# Patient Record
Sex: Female | Born: 1955 | Race: White | Hispanic: No | Marital: Married | Smoking: Never smoker
Health system: Southern US, Community
[De-identification: ages and names within clinical notes are randomized; demographics above are authoritative.]

## PROBLEM LIST (undated history)

## (undated) DIAGNOSIS — K2970 Gastritis, unspecified, without bleeding: Secondary | ICD-10-CM

## (undated) DIAGNOSIS — E785 Hyperlipidemia, unspecified: Secondary | ICD-10-CM

## (undated) DIAGNOSIS — I10 Essential (primary) hypertension: Secondary | ICD-10-CM

## (undated) DIAGNOSIS — H409 Unspecified glaucoma: Secondary | ICD-10-CM

## (undated) HISTORY — PX: THYROID SURGERY: SHX805

## (undated) HISTORY — PX: SHOULDER SURGERY: SHX246

## (undated) HISTORY — PX: CATARACT EXTRACTION, BILATERAL: SHX1313

---

## 2017-06-16 ENCOUNTER — Other Ambulatory Visit: Payer: Self-pay | Admitting: Family Medicine

## 2017-06-16 DIAGNOSIS — M545 Low back pain, unspecified: Secondary | ICD-10-CM

## 2017-06-17 ENCOUNTER — Ambulatory Visit
Admission: RE | Admit: 2017-06-17 | Discharge: 2017-06-17 | Disposition: A | Discharge status: Home / Self Care | Payer: Self-pay | Source: Ambulatory Visit | Attending: Family Medicine | Admitting: Family Medicine

## 2017-07-21 ENCOUNTER — Encounter
Admission: RE | Admit: 2017-07-21 | Discharge: 2017-07-21 | Disposition: A | Discharge status: Home / Self Care | Payer: BLUE CROSS/BLUE SHIELD | Source: Ambulatory Visit | Attending: Neurosurgery | Admitting: Neurosurgery

## 2017-07-21 DIAGNOSIS — I10 Essential (primary) hypertension: Secondary | ICD-10-CM

## 2017-07-21 DIAGNOSIS — Z23 Encounter for immunization: Secondary | ICD-10-CM | POA: Diagnosis not present

## 2017-07-21 DIAGNOSIS — M48061 Spinal stenosis, lumbar region without neurogenic claudication: Secondary | ICD-10-CM | POA: Diagnosis not present

## 2017-07-21 DIAGNOSIS — Z79899 Other long term (current) drug therapy: Secondary | ICD-10-CM | POA: Diagnosis not present

## 2017-07-21 DIAGNOSIS — Z01812 Encounter for preprocedural laboratory examination: Secondary | ICD-10-CM | POA: Insufficient documentation

## 2017-07-21 DIAGNOSIS — M5116 Intervertebral disc disorders with radiculopathy, lumbar region: Secondary | ICD-10-CM | POA: Diagnosis not present

## 2017-07-21 DIAGNOSIS — Z01810 Encounter for preprocedural cardiovascular examination: Secondary | ICD-10-CM | POA: Insufficient documentation

## 2017-07-21 DIAGNOSIS — E785 Hyperlipidemia, unspecified: Secondary | ICD-10-CM | POA: Insufficient documentation

## 2017-07-21 HISTORY — DX: Gastritis, unspecified, without bleeding: K29.70

## 2017-07-21 HISTORY — DX: Unspecified glaucoma: H40.9

## 2017-07-21 HISTORY — DX: Essential (primary) hypertension: I10

## 2017-07-21 HISTORY — DX: Hyperlipidemia, unspecified: E78.5

## 2017-07-21 LAB — CBC
HEMATOCRIT: 43.5 % (ref 35.0–47.0)
HEMOGLOBIN: 15.1 g/dL (ref 12.0–16.0)
MCH: 31.3 pg (ref 26.0–34.0)
MCHC: 34.7 g/dL (ref 32.0–36.0)
MCV: 90.2 fL (ref 80.0–100.0)
Platelets: 163 10*3/uL (ref 150–440)
RBC: 4.81 MIL/uL (ref 3.80–5.20)
RDW: 12.2 % (ref 11.5–14.5)
WBC: 5.2 10*3/uL (ref 3.6–11.0)

## 2017-07-21 LAB — URINALYSIS, ROUTINE W REFLEX MICROSCOPIC
Bilirubin Urine: NEGATIVE
GLUCOSE, UA: NEGATIVE mg/dL
Hgb urine dipstick: NEGATIVE
KETONES UR: NEGATIVE mg/dL
LEUKOCYTES UA: NEGATIVE
NITRITE: NEGATIVE
PROTEIN: NEGATIVE mg/dL
Specific Gravity, Urine: 1.02 (ref 1.005–1.030)
pH: 5 (ref 5.0–8.0)

## 2017-07-21 LAB — DIFFERENTIAL
BASOS ABS: 0 10*3/uL (ref 0–0.1)
BASOS PCT: 0 %
EOS ABS: 0.2 10*3/uL (ref 0–0.7)
Eosinophils Relative: 3 %
Lymphocytes Relative: 26 %
Lymphs Abs: 1.4 10*3/uL (ref 1.0–3.6)
MONOS PCT: 8 %
Monocytes Absolute: 0.4 10*3/uL (ref 0.2–0.9)
NEUTROS PCT: 63 %
Neutro Abs: 3.3 10*3/uL (ref 1.4–6.5)

## 2017-07-21 LAB — BASIC METABOLIC PANEL
ANION GAP: 7 (ref 5–15)
BUN: 22 mg/dL — ABNORMAL HIGH (ref 6–20)
CALCIUM: 9.4 mg/dL (ref 8.9–10.3)
CO2: 31 mmol/L (ref 22–32)
Chloride: 101 mmol/L (ref 101–111)
Creatinine, Ser: 0.5 mg/dL (ref 0.44–1.00)
GFR calc non Af Amer: >60 mL/min (ref >60)
GLUCOSE: 133 mg/dL — AB (ref 65–99)
Potassium: 4.1 mmol/L (ref 3.5–5.1)
Sodium: 139 mmol/L (ref 135–145)

## 2017-07-21 LAB — SURGICAL PCR SCREEN
MRSA, PCR: NEGATIVE
Staphylococcus aureus: NEGATIVE

## 2017-07-21 LAB — TYPE AND SCREEN
ABO/RH(D): O POS
ANTIBODY SCREEN: NEGATIVE

## 2017-07-21 LAB — PROTIME-INR
INR: 0.9
Prothrombin Time: 12.1 seconds (ref 11.4–15.2)

## 2017-07-21 LAB — APTT: APTT: 32 s (ref 24–36)

## 2017-07-21 NOTE — Patient Instructions (Signed)
Your procedure is scheduled on: Wednesday 07/23/17 Report to DAY SURGERY. 2ND FLOOR MEDICAL MALL ENTRANCE. To find out your arrival time please call [phone] between 1PM - 3PM on Tuesday 07/22/17.  Remember: Instructions that are not followed completely may result in serious medical risk, up to and including death, or upon the discretion of your surgeon and anesthesiologist your surgery may need to be rescheduled.    __X__ 1. Do not eat anything after midnight the night before your    procedure.  No gum chewing or hard candies.  You may drink clear   liquids up to 2 hours before you are scheduled to arrive at the   hospital for your procedure. Do not drink clear liquids within 2   hours of scheduled arrival to the hospital as this may lead to your   procedure being delayed or rescheduled.       Clear liquids include:   Water or Apple juice without pulp   Clear carbohydrate beverage such as Clearfast or Gatorade   Black coffee or Clear Tea (no milk, no creamer, do not add anything   to the coffee or tea)    Diabetics should only drink water   __X__ 2. No Alcohol for 24 hours before or after surgery.   ____ 3. Bring all medications with you on the day of surgery if instructed.    __X__ 4. Notify your doctor if there is any change in your medical condition     (cold, fever, infections).             __X___5. No smoking within 24 hours of your surgery.     Do not wear jewelry, make-up, hairpins, clips or nail polish.  Do not wear lotions, powders, or perfumes.   Do not shave 48 hours prior to surgery. Men may shave face and neck.  Do not bring valuables to the hospital.    [facility] is not responsible for any belongings or valuables.               Contacts, dentures or bridgework may not be worn into surgery.  Leave your suitcase in the car. After surgery it may be brought to your room.  For patients admitted to the hospital, discharge time is determined by your                 treatment team.   Patients discharged the day of surgery will not be allowed to drive home.   Please read over the following fact sheets that you were given:   MRSA Information   __X__ Take these medicines the morning of surgery with A SIP OF WATER:    1. GABAPENTIN  2. PANTOPRAZOLE  3.   4.  5.  6.  ____ Fleet Enema (as directed)   __X__ Use CHG Soap/SAGE wipes as directed  ____ Use inhalers on the day of surgery  ____ Stop metformin 2 days prior to surgery    ____ Take 1/2 of usual insulin dose the night before surgery and none on the morning of surgery.   ____ Stop Coumadin/Plavix/aspirin on   __X__ Stop Anti-inflammatories such as Advil, Aleve, Ibuprofen, Motrin, Naproxen, Naprosyn, Goodies,powder, or aspirin products.  OK to take Tylenol.   __X__ Stop supplements, Vitamin E, Fish Oil until after surgery.    ____ Bring C-Pap to the hospital.

## 2017-07-23 ENCOUNTER — Observation Stay
Admission: RE | Admit: 2017-07-23 | Discharge: 2017-07-25 | Disposition: A | Discharge status: Home / Self Care | Payer: BLUE CROSS/BLUE SHIELD | Source: Ambulatory Visit | Attending: Neurosurgery | Admitting: Neurosurgery

## 2017-07-23 ENCOUNTER — Encounter
Admission: RE | Disposition: A | Discharge status: Home / Self Care | Payer: Self-pay | Source: Ambulatory Visit | Attending: Neurosurgery

## 2017-07-23 ENCOUNTER — Ambulatory Visit: Payer: BLUE CROSS/BLUE SHIELD | Admitting: Certified Registered Nurse Anesthetist

## 2017-07-23 ENCOUNTER — Ambulatory Visit: Payer: BLUE CROSS/BLUE SHIELD

## 2017-07-23 DIAGNOSIS — M48062 Spinal stenosis, lumbar region with neurogenic claudication: Secondary | ICD-10-CM

## 2017-07-23 DIAGNOSIS — I10 Essential (primary) hypertension: Secondary | ICD-10-CM | POA: Insufficient documentation

## 2017-07-23 DIAGNOSIS — Z23 Encounter for immunization: Secondary | ICD-10-CM | POA: Insufficient documentation

## 2017-07-23 DIAGNOSIS — E785 Hyperlipidemia, unspecified: Secondary | ICD-10-CM | POA: Insufficient documentation

## 2017-07-23 DIAGNOSIS — M5116 Intervertebral disc disorders with radiculopathy, lumbar region: Secondary | ICD-10-CM | POA: Diagnosis not present

## 2017-07-23 DIAGNOSIS — Z419 Encounter for procedure for purposes other than remedying health state, unspecified: Secondary | ICD-10-CM

## 2017-07-23 DIAGNOSIS — Z79899 Other long term (current) drug therapy: Secondary | ICD-10-CM | POA: Insufficient documentation

## 2017-07-23 DIAGNOSIS — M48061 Spinal stenosis, lumbar region without neurogenic claudication: Secondary | ICD-10-CM | POA: Insufficient documentation

## 2017-07-23 HISTORY — PX: LUMBAR LAMINECTOMY/DECOMPRESSION MICRODISCECTOMY: SHX5026

## 2017-07-23 LAB — ABO/RH: ABO/RH(D): O POS

## 2017-07-23 SURGERY — LUMBAR LAMINECTOMY/DECOMPRESSION MICRODISCECTOMY 2 LEVELS
Anesthesia: General | Site: Spine Lumbar | Wound class: Clean

## 2017-07-23 MED ORDER — ONDANSETRON HCL 4 MG/2ML IJ SOLN: 4.0000 mg | Freq: Once | INTRAMUSCULAR | Status: DC | PRN

## 2017-07-23 MED ORDER — SUGAMMADEX SODIUM 200 MG/2ML IV SOLN
INTRAVENOUS | Status: AC
  Filled 2017-07-23: qty 2

## 2017-07-23 MED ORDER — FENTANYL CITRATE (PF) 100 MCG/2ML IJ SOLN
INTRAMUSCULAR | Status: AC
  Filled 2017-07-23: qty 2

## 2017-07-23 MED ORDER — SODIUM CHLORIDE 0.9 % IV SOLN
INTRAVENOUS | Status: DC
  Administered 2017-07-23 – 2017-07-24 (×2): via INTRAVENOUS

## 2017-07-23 MED ORDER — ONDANSETRON HCL 4 MG/2ML IJ SOLN
INTRAMUSCULAR | Status: AC
  Filled 2017-07-23: qty 2

## 2017-07-23 MED ORDER — SODIUM CHLORIDE 0.9 % IV SOLN
INTRAVENOUS | Status: DC | PRN
  Administered 2017-07-23: 50 ug/min via INTRAVENOUS

## 2017-07-23 MED ORDER — BUPIVACAINE-EPINEPHRINE (PF) 0.5% -1:200000 IJ SOLN
INTRAMUSCULAR | Status: DC | PRN
  Administered 2017-07-23: 6 mL

## 2017-07-23 MED ORDER — DEXAMETHASONE SODIUM PHOSPHATE 10 MG/ML IJ SOLN
INTRAMUSCULAR | Status: DC | PRN
  Administered 2017-07-23: 10 mg via INTRAVENOUS

## 2017-07-23 MED ORDER — AMLODIPINE BESYLATE-VALSARTAN 5-160 MG PO TABS: 1.0000 | ORAL_TABLET | Freq: Every day | ORAL | Status: DC

## 2017-07-23 MED ORDER — PHENYLEPHRINE HCL 10 MG/ML IJ SOLN
INTRAMUSCULAR | Status: AC
  Filled 2017-07-23: qty 1

## 2017-07-23 MED ORDER — SODIUM CHLORIDE 0.9 % IV SOLN
INTRAVENOUS | Status: DC | PRN
  Administered 2017-07-23: 40 mL

## 2017-07-23 MED ORDER — SUCCINYLCHOLINE CHLORIDE 20 MG/ML IJ SOLN
INTRAMUSCULAR | Status: AC
  Filled 2017-07-23: qty 1

## 2017-07-23 MED ORDER — ONDANSETRON HCL 4 MG/2ML IJ SOLN
4.0000 mg | Freq: Four times a day (QID) | INTRAMUSCULAR | Status: DC | PRN
  Administered 2017-07-23 – 2017-07-24 (×2): 4 mg via INTRAVENOUS
  Filled 2017-07-23 (×2): qty 2

## 2017-07-23 MED ORDER — SENNOSIDES-DOCUSATE SODIUM 8.6-50 MG PO TABS: 1.0000 | ORAL_TABLET | Freq: Every evening | ORAL | Status: DC | PRN

## 2017-07-23 MED ORDER — IRBESARTAN 150 MG PO TABS
150.0000 mg | ORAL_TABLET | Freq: Every day | ORAL | Status: DC
  Administered 2017-07-24 – 2017-07-25 (×2): 150 mg via ORAL
  Filled 2017-07-23 (×2): qty 1

## 2017-07-23 MED ORDER — MIDAZOLAM HCL 2 MG/2ML IJ SOLN
INTRAMUSCULAR | Status: DC | PRN
  Administered 2017-07-23: 2 mg via INTRAVENOUS

## 2017-07-23 MED ORDER — PROPOFOL 10 MG/ML IV BOLUS
INTRAVENOUS | Status: DC | PRN
  Administered 2017-07-23: 120 mg via INTRAVENOUS

## 2017-07-23 MED ORDER — FENTANYL CITRATE (PF) 100 MCG/2ML IJ SOLN
INTRAMUSCULAR | Status: DC | PRN
  Administered 2017-07-23 (×2): 25 ug via INTRAVENOUS
  Administered 2017-07-23 (×2): 50 ug via INTRAVENOUS

## 2017-07-23 MED ORDER — ACETAMINOPHEN 10 MG/ML IV SOLN
INTRAVENOUS | Status: AC
  Filled 2017-07-23: qty 100

## 2017-07-23 MED ORDER — EPHEDRINE SULFATE 50 MG/ML IJ SOLN
INTRAMUSCULAR | Status: DC | PRN
  Administered 2017-07-23: 10 mg via INTRAVENOUS
  Administered 2017-07-23: 5 mg via INTRAVENOUS
  Administered 2017-07-23: 10 mg via INTRAVENOUS

## 2017-07-23 MED ORDER — CEFAZOLIN SODIUM-DEXTROSE 1-4 GM/50ML-% IV SOLN
1.0000 g | Freq: Once | INTRAVENOUS | Status: AC
  Administered 2017-07-23: 1 g via INTRAVENOUS

## 2017-07-23 MED ORDER — METHOCARBAMOL 500 MG PO TABS: 500.0000 mg | ORAL_TABLET | Freq: Four times a day (QID) | ORAL | Status: DC | PRN

## 2017-07-23 MED ORDER — BUPIVACAINE HCL (PF) 0.5 % IJ SOLN
INTRAMUSCULAR | Status: DC | PRN
  Administered 2017-07-23: 20 mL

## 2017-07-23 MED ORDER — PROPOFOL 10 MG/ML IV BOLUS
INTRAVENOUS | Status: AC
  Filled 2017-07-23: qty 20

## 2017-07-23 MED ORDER — DEXAMETHASONE SODIUM PHOSPHATE 4 MG/ML IJ SOLN
3.0000 mg | Freq: Three times a day (TID) | INTRAMUSCULAR | Status: DC
  Administered 2017-07-24 – 2017-07-25 (×5): 3 mg via INTRAVENOUS
  Filled 2017-07-23 (×7): qty 0.75

## 2017-07-23 MED ORDER — ACETAMINOPHEN 650 MG RE SUPP: 650.0000 mg | RECTAL | Status: DC | PRN

## 2017-07-23 MED ORDER — FENTANYL CITRATE (PF) 100 MCG/2ML IJ SOLN: 25.0000 ug | INTRAMUSCULAR | Status: DC | PRN

## 2017-07-23 MED ORDER — SODIUM CHLORIDE 0.9% FLUSH
3.0000 mL | Freq: Two times a day (BID) | INTRAVENOUS | Status: DC
  Administered 2017-07-24 – 2017-07-25 (×2): 3 mL via INTRAVENOUS

## 2017-07-23 MED ORDER — BACITRACIN 50000 UNITS IM SOLR
INTRAMUSCULAR | Status: DC | PRN
  Administered 2017-07-23: 50000 [IU]

## 2017-07-23 MED ORDER — ONDANSETRON HCL 4 MG/2ML IJ SOLN
INTRAMUSCULAR | Status: DC | PRN
  Administered 2017-07-23: 4 mg via INTRAVENOUS

## 2017-07-23 MED ORDER — OXYCODONE HCL 5 MG PO TABS
5.0000 mg | ORAL_TABLET | ORAL | Status: DC | PRN
  Administered 2017-07-25: 5 mg via ORAL
  Filled 2017-07-23: qty 2

## 2017-07-23 MED ORDER — METHOCARBAMOL 1000 MG/10ML IJ SOLN
500.0000 mg | Freq: Four times a day (QID) | INTRAVENOUS | Status: DC | PRN
  Filled 2017-07-23: qty 5

## 2017-07-23 MED ORDER — SUCCINYLCHOLINE CHLORIDE 20 MG/ML IJ SOLN
INTRAMUSCULAR | Status: DC | PRN
  Administered 2017-07-23: 100 mg via INTRAVENOUS

## 2017-07-23 MED ORDER — ACETAMINOPHEN 325 MG PO TABS: 650.0000 mg | ORAL_TABLET | ORAL | Status: DC | PRN

## 2017-07-23 MED ORDER — PANTOPRAZOLE SODIUM 40 MG PO TBEC
40.0000 mg | DELAYED_RELEASE_TABLET | Freq: Two times a day (BID) | ORAL | Status: DC
  Administered 2017-07-23 – 2017-07-25 (×4): 40 mg via ORAL
  Filled 2017-07-23 (×4): qty 1

## 2017-07-23 MED ORDER — INFLUENZA VAC SPLIT QUAD 0.5 ML IM SUSY
0.5000 mL | PREFILLED_SYRINGE | INTRAMUSCULAR | Status: AC
  Administered 2017-07-24: 0.5 mL via INTRAMUSCULAR
  Filled 2017-07-23: qty 0.5

## 2017-07-23 MED ORDER — GABAPENTIN 300 MG PO CAPS
300.0000 mg | ORAL_CAPSULE | Freq: Three times a day (TID) | ORAL | Status: DC
  Administered 2017-07-23 – 2017-07-25 (×6): 300 mg via ORAL
  Filled 2017-07-23 (×6): qty 1

## 2017-07-23 MED ORDER — ACETAMINOPHEN 10 MG/ML IV SOLN
INTRAVENOUS | Status: DC | PRN
  Administered 2017-07-23: 1000 mg via INTRAVENOUS

## 2017-07-23 MED ORDER — SODIUM CHLORIDE 0.9 % IV SOLN
250.0000 mL | INTRAVENOUS | Status: DC
  Administered 2017-07-23: 250 mL via INTRAVENOUS

## 2017-07-23 MED ORDER — ACETAMINOPHEN 500 MG PO TABS
1000.0000 mg | ORAL_TABLET | Freq: Four times a day (QID) | ORAL | Status: AC
  Administered 2017-07-23 – 2017-07-24 (×4): 1000 mg via ORAL
  Filled 2017-07-23 (×4): qty 2

## 2017-07-23 MED ORDER — THROMBIN 5000 UNITS EX SOLR
CUTANEOUS | Status: DC | PRN
Start: 2017-07-23 — End: 2017-07-23
  Administered 2017-07-23: 5000 [IU] via TOPICAL

## 2017-07-23 MED ORDER — DEXAMETHASONE SODIUM PHOSPHATE 10 MG/ML IJ SOLN
INTRAMUSCULAR | Status: AC
  Filled 2017-07-23: qty 1

## 2017-07-23 MED ORDER — HYDROMORPHONE HCL 1 MG/ML IJ SOLN: 0.5000 mg | INTRAMUSCULAR | Status: DC | PRN

## 2017-07-23 MED ORDER — METHYLPREDNISOLONE ACETATE 40 MG/ML IJ SUSP
INTRAMUSCULAR | Status: DC | PRN
  Administered 2017-07-23: 40 mg

## 2017-07-23 MED ORDER — ROCURONIUM BROMIDE 50 MG/5ML IV SOLN
INTRAVENOUS | Status: AC
  Filled 2017-07-23: qty 1

## 2017-07-23 MED ORDER — LACTATED RINGERS IV SOLN
INTRAVENOUS | Status: DC
  Administered 2017-07-23 (×2): via INTRAVENOUS

## 2017-07-23 MED ORDER — MIDAZOLAM HCL 2 MG/2ML IJ SOLN
INTRAMUSCULAR | Status: AC
  Filled 2017-07-23: qty 2

## 2017-07-23 MED ORDER — ROCURONIUM BROMIDE 100 MG/10ML IV SOLN
INTRAVENOUS | Status: DC | PRN
  Administered 2017-07-23 (×2): 5 mg via INTRAVENOUS
  Administered 2017-07-23: 30 mg via INTRAVENOUS
  Administered 2017-07-23 (×2): 10 mg via INTRAVENOUS

## 2017-07-23 MED ORDER — ONDANSETRON HCL 4 MG PO TABS: 4.0000 mg | ORAL_TABLET | Freq: Four times a day (QID) | ORAL | Status: DC | PRN

## 2017-07-23 MED ORDER — AMLODIPINE BESYLATE 5 MG PO TABS
5.0000 mg | ORAL_TABLET | Freq: Every day | ORAL | Status: DC
  Administered 2017-07-24: 5 mg via ORAL
  Filled 2017-07-23 (×2): qty 1

## 2017-07-23 MED ORDER — SODIUM CHLORIDE 0.9% FLUSH: 3.0000 mL | INTRAVENOUS | Status: DC | PRN

## 2017-07-23 MED ORDER — LIDOCAINE HCL (CARDIAC) 20 MG/ML IV SOLN
INTRAVENOUS | Status: DC | PRN
  Administered 2017-07-23: 50 mg via INTRAVENOUS

## 2017-07-23 MED ORDER — GELATIN ABSORBABLE 12-7 MM EX MISC
CUTANEOUS | Status: DC | PRN
Start: 2017-07-23 — End: 2017-07-23
  Administered 2017-07-23: 1

## 2017-07-23 MED ORDER — SUGAMMADEX SODIUM 200 MG/2ML IV SOLN
INTRAVENOUS | Status: DC | PRN
  Administered 2017-07-23: 113.4 mg via INTRAVENOUS

## 2017-07-23 SURGICAL SUPPLY — 63 items
BLADE BOVIE TIP EXT 4 (BLADE) ×2 IMPLANT
BUR NEURO DRILL SOFT 3.0X3.8M (BURR) ×2 IMPLANT
CANISTER SUCT 1200ML W/VALVE (MISCELLANEOUS) ×4 IMPLANT
CHLORAPREP W/TINT 26ML (MISCELLANEOUS) ×4 IMPLANT
CNTNR SPEC 2.5X3XGRAD LEK (MISCELLANEOUS) ×1
CONT SPEC 4OZ STER OR WHT (MISCELLANEOUS) ×1
CONTAINER SPEC 2.5X3XGRAD LEK (MISCELLANEOUS) ×1 IMPLANT
COUNTER NEEDLE 20/40 LG (NEEDLE) ×2 IMPLANT
COVER LIGHT HANDLE STERIS (MISCELLANEOUS) ×4 IMPLANT
CUP MEDICINE 2OZ PLAST GRAD ST (MISCELLANEOUS) ×4 IMPLANT
DERMABOND ADVANCED (GAUZE/BANDAGES/DRESSINGS) ×1
DERMABOND ADVANCED .7 DNX12 (GAUZE/BANDAGES/DRESSINGS) ×1 IMPLANT
DRAPE C-ARM 42X72 X-RAY (DRAPES) ×4 IMPLANT
DRAPE LAPAROTOMY 100X77 ABD (DRAPES) ×2 IMPLANT
DRAPE MICROSCOPE SPINE 48X150 (DRAPES) ×2 IMPLANT
DRAPE POUCH INSTRU U-SHP 10X18 (DRAPES) ×2 IMPLANT
DRAPE SURG 17X11 SM STRL (DRAPES) ×8 IMPLANT
DRSG TEGADERM 4X4.75 (GAUZE/BANDAGES/DRESSINGS) ×2 IMPLANT
DRSG TELFA 4X3 1S NADH ST (GAUZE/BANDAGES/DRESSINGS) IMPLANT
DURASEAL APPLICATOR TIP (TIP) IMPLANT
DURASEAL SPINE SEALANT 3ML (MISCELLANEOUS) IMPLANT
ELECT CAUTERY BLADE TIP 2.5 (TIP) ×2
ELECT EZSTD 165MM 6.5IN (MISCELLANEOUS) ×2
ELECTRODE CAUTERY BLDE TIP 2.5 (TIP) ×1 IMPLANT
ELECTRODE EZSTD 165MM 6.5IN (MISCELLANEOUS) ×1 IMPLANT
FRAME EYE SHIELD (PROTECTIVE WEAR) ×2 IMPLANT
GLOVE BIO SURGEON STRL SZ 6.5 (GLOVE) ×4 IMPLANT
GLOVE BIOGEL PI IND STRL 7.0 (GLOVE) ×2 IMPLANT
GLOVE BIOGEL PI INDICATOR 7.0 (GLOVE) ×2
GLOVE SURG SYN 8.5  E (GLOVE) ×3
GLOVE SURG SYN 8.5 E (GLOVE) ×3 IMPLANT
GOWN SRG XL LVL 3 NONREINFORCE (GOWNS) ×1 IMPLANT
GOWN STRL NON-REIN TWL XL LVL3 (GOWNS) ×1
GOWN STRL REUS W/ TWL LRG LVL3 (GOWN DISPOSABLE) ×1 IMPLANT
GOWN STRL REUS W/TWL LRG LVL3 (GOWN DISPOSABLE) ×1
GRADUATE 1200CC STRL 31836 (MISCELLANEOUS) ×2 IMPLANT
IV CATH ANGIO 12GX3 LT BLUE (NEEDLE) ×2 IMPLANT
KIT SPINAL PRONEVIEW (KITS) ×2 IMPLANT
KNIFE BAYONET SHORT DISCETOMY (MISCELLANEOUS) ×2 IMPLANT
MARKER SKIN DUAL TIP RULER LAB (MISCELLANEOUS) ×6 IMPLANT
NDL SAFETY ECLIPSE 18X1.5 (NEEDLE) ×1 IMPLANT
NEEDLE HYPO 18GX1.5 SHARP (NEEDLE) ×1
NEEDLE HYPO 22GX1.5 SAFETY (NEEDLE) ×2 IMPLANT
NS IRRIG 1000ML POUR BTL (IV SOLUTION) ×2 IMPLANT
PACK LAMINECTOMY NEURO (CUSTOM PROCEDURE TRAY) ×2 IMPLANT
PAD ARMBOARD 7.5X6 YLW CONV (MISCELLANEOUS) ×2 IMPLANT
PATTIES SURGICAL .5X1.5 (GAUZE/BANDAGES/DRESSINGS) IMPLANT
SPOGE SURGIFLO 8M (HEMOSTASIS) ×2
SPONGE SURGIFLO 8M (HEMOSTASIS) ×2 IMPLANT
STAPLER SKIN PROX 35W (STAPLE) IMPLANT
SUT DVC VLOC 3-0 CL 6 P-12 (SUTURE) ×2 IMPLANT
SUT NURALON 4 0 TR CR/8 (SUTURE) IMPLANT
SUT VIC AB 0 CT1 27 (SUTURE) ×1
SUT VIC AB 0 CT1 27XCR 8 STRN (SUTURE) ×1 IMPLANT
SUT VIC AB 2-0 CT1 18 (SUTURE) ×2 IMPLANT
SUT VICRYL 0 UR6 27IN ABS (SUTURE) IMPLANT
SYR 20CC LL (SYRINGE) ×2 IMPLANT
SYR 30ML LL (SYRINGE) ×4 IMPLANT
SYR 3ML LL SCALE MARK (SYRINGE) ×2 IMPLANT
SYRINGE 10CC LL (SYRINGE) ×2 IMPLANT
TOWEL OR 17X26 4PK STRL BLUE (TOWEL DISPOSABLE) ×8 IMPLANT
TUBE METRX 18MMX5CM (INSTRUMENTS) ×2 IMPLANT
TUBING CONNECTING 10 (TUBING) ×2 IMPLANT

## 2017-07-23 NOTE — Anesthesia Preprocedure Evaluation (Signed)
Anesthesia Evaluation  Patient identified by MRN, date of birth, ID band Patient awake    Reviewed: Allergy & Precautions, H&P , NPO status , Patient's Chart, lab work & pertinent test results, reviewed documented beta blocker date and time   Airway Mallampati: II  TM Distance: >3 FB Neck ROM: full    Dental  (+) Teeth Intact   Pulmonary neg pulmonary ROS,    Pulmonary exam normal        Cardiovascular Exercise Tolerance: Good hypertension, On Medications negative cardio ROS Normal cardiovascular exam Rhythm:regular Rate:Normal     Neuro/Psych negative neurological ROS  negative psych ROS   GI/Hepatic negative GI ROS, Neg liver ROS,   Endo/Other  negative endocrine ROS  Renal/GU negative Renal ROS  negative genitourinary   Musculoskeletal   Abdominal   Peds  Hematology negative hematology ROS (+)   Anesthesia Other Findings Past Medical History: No date: Gastritis No date: Glaucoma No date: HLD (hyperlipidemia) No date: Hypertension Past Surgical History: No date: CATARACT EXTRACTION, BILATERAL No date: SHOULDER SURGERY; Right No date: THYROID SURGERY   Reproductive/Obstetrics negative OB ROS                             Anesthesia Physical Anesthesia Plan  ASA: II  Anesthesia Plan: General ETT   Post-op Pain Management:    Induction:   PONV Risk Score and Plan: 4 or greater and Ondansetron, Dexamethasone, Midazolam and Propofol infusion  Airway Management Planned:   Additional Equipment:   Intra-op Plan:   Post-operative Plan:   Informed Consent: I have reviewed the patients History and Physical, chart, labs and discussed the procedure including the risks, benefits and alternatives for the proposed anesthesia with the patient or authorized representative who has indicated his/her understanding and acceptance.   Dental Advisory Given  Plan Discussed with:  CRNA  Anesthesia Plan Comments:         Anesthesia Quick Evaluation

## 2017-07-23 NOTE — Anesthesia Procedure Notes (Signed)
Procedure Name: Intubation Date/Time: 07/23/2017 1:19 PM Performed by: Darlyne Russian Pre-anesthesia Checklist: Patient identified, Emergency Drugs available, Suction available, Patient being monitored and Timeout performed Patient Re-evaluated:Patient Re-evaluated prior to induction Oxygen Delivery Method: Circle system utilized Preoxygenation: Pre-oxygenation with 100% oxygen Induction Type: IV induction Ventilation: Mask ventilation without difficulty Laryngoscope Size: Mac and 3 Grade View: Grade II Tube type: Oral Number of attempts: 1 Airway Equipment and Method: Stylet Placement Confirmation: ETT inserted through vocal cords under direct vision,  positive ETCO2 and breath sounds checked- equal and bilateral Secured at: 22 cm Tube secured with: Tape Dental Injury: Teeth and Oropharynx as per pre-operative assessment

## 2017-07-23 NOTE — Transfer of Care (Signed)
Immediate Anesthesia Transfer of Care Note  Patient: Catherine Graham  Procedure(s) Performed: Procedure(s): LUMBAR LAMINECTOMY/DECOMPRESSION MICRODISCECTOMY 2 LEVELS L4-5,POSS.L5-S1 (N/A)  Patient Location: PACU  Anesthesia Type:General  Level of Consciousness: sedated and responds to stimulation  Airway & Oxygen Therapy: Patient Spontanous Breathing and Patient connected to face mask oxygen  Post-op Assessment: Report given to RN and Post -op Vital signs reviewed and stable  Post vital signs: Reviewed and stable  Last Vitals:  Vitals:   07/23/17 1239 07/23/17 1538  BP: 127/80 105/62  Pulse: 76 67  Resp: 16 20  Temp: (!) 36.2 C 36.7 C  SpO2: 99% 100%    Last Pain:  Vitals:   07/23/17 1538  TempSrc:   PainSc: Asleep         Complications: No apparent anesthesia complications

## 2017-07-23 NOTE — Anesthesia Postprocedure Evaluation (Signed)
Anesthesia Post Note  Patient: Catherine Graham  Procedure(s) Performed: Procedure(s) (LRB): LUMBAR LAMINECTOMY/DECOMPRESSION MICRODISCECTOMY 2 LEVELS L4-5,POSS.L5-S1 (N/A)  Patient location during evaluation: PACU Anesthesia Type: General Level of consciousness: awake and alert Pain management: pain level controlled Vital Signs Assessment: post-procedure vital signs reviewed and stable Respiratory status: spontaneous breathing, nonlabored ventilation, respiratory function stable and patient connected to nasal cannula oxygen Cardiovascular status: blood pressure returned to baseline and stable Postop Assessment: no apparent nausea or vomiting Anesthetic complications: no     Last Vitals:  Vitals:   07/23/17 1623 07/23/17 1647  BP:  131/72  Pulse: 74 72  Resp: 16 12  Temp: (!) 36.1 C 36.6 C  SpO2: 98% 99%    Last Pain:  Vitals:   07/23/17 1728  TempSrc:   PainSc: 3                  Lenard Simmer

## 2017-07-23 NOTE — Anesthesia Post-op Follow-up Note (Signed)
Anesthesia QCDR form completed.        

## 2017-07-23 NOTE — Progress Notes (Signed)
  History: Catherine Graham is POD0 s/p L4-5 lumbar decompression including central laminectomy and bilateral medial facetectomies including foraminotomies and right L5-S1 far lateral discectomy. Post operatively patient is doing well, no translator available at the time I spoke with her. Does complain of some right sided low lumbar back pain, where incision sites were made. Denies leg pain at this time.   Physical Exam: Vitals:   07/23/17 1623 07/23/17 1647  BP:  131/72  Pulse: 74 72  Resp: 16 12  Temp: (!) 97 F (36.1 C) 97.8 F (36.6 C)  SpO2: 98% 99%    AA Ox3 CNI  Strength:5/5 throughout, sensation intact BLE, BUE  Assessment/Plan:  Catherine Graham is POD0 and doing well postoperatively. Due to language barrier I am unsure how much she understood the questions I asked. Only complaint at this time is pain at incision site. Pain controlled with robaxin, oxycodone, and dexamethasone. Will reevaluate in the morning. Translator requested.   Plan:  - mobilize - pain control - DVT prophylaxis - PTOT   Ivar Drape PA-C Department of Neurosurgery

## 2017-07-23 NOTE — Op Note (Signed)
Indications: Mrs. Dolores Frame is a 61 yo female with worsening R L5 radiculopathy due to stenosis and L5-S1 disc herniation.  She failed conservative management and elected for surgical intervention.  Findings: L5-S1 disc herniation  Preoperative Diagnosis: Lumbar radiculopathy L5 Postoperative Diagnosis: same   EBL: 25 ml IVF: 1000 ml Drains: none Disposition: Extubated and Stable to PACU Complications: none  No foley catheter was placed.   Preoperative Note:   Risks of surgery discussed include: infection, bleeding, stroke, coma, death, paralysis, CSF leak, nerve/spinal cord injury, numbness, tingling, weakness, complex regional pain syndrome, recurrent stenosis and/or disc herniation, vascular injury, development of instability, neck/back pain, need for further surgery, persistent symptoms, development of deformity, and the risks of anesthesia. They understood these risks and have agreed to proceed.  Operative Note:   1. L4-5 lumbar decompression including central laminectomy and bilateral medial facetectomies including foraminotomies 2. Right L5-S1 far lateral discectomy  The patient was then brought from the preoperative center with intravenous access established.  The patient underwent general anesthesia and endotracheal tube intubation, and was then rotated on the [place] rail top where all pressure points were appropriately padded.  The skin was then thoroughly cleansed.  Perioperative antibiotic prophylaxis was administered.  Sterile prep and drapes were then applied and a timeout was then observed.  C-arm was brought into the field under sterile conditions and under lateral visualization the L4-5 and L5-S1 interspace was identified and marked.  The incision was marked on the right and injected with local anesthetic. Once this was complete a 2 cm incision was opened with the use of a #10 blade knife.  The metrx tubes were sequentially advanced and confirmed in position. An 18mm by 50mm  tube was locked in place to the bed side attachment.  Fluoroscopy was then removed from the field.  The microscope was then sterilely brought into the field and muscle creep was hemostased with a bipolar and resected with a pituitary rongeur.  A Bovie extender was then used to expose the spinous process and lamina.  Careful attention was placed to not violate the facet capsule. A 3 mm matchstick drill bit was then used to make a hemi-laminotomy trough until the ligamentum flavum was exposed.  This was extended to the base of the spinous process and to the contralateral side to remove all the central bone from each side.  Once this was complete and the underlying ligamentum flavum was visualized, it was dissected with a curette and resected with Kerrison rongeurs.  Extensive ligamentum hypertrophy was noted, requiring a substantial amount of time and care for removal.  The dura was identified and palpated. The kerrison rongeur was then used to remove the medial facet bilaterally until no compression was noted.  A balltip probe was used to confirm decompression of the right L5 nerve root at the level of the lateral recess.  Additional attention was paid to completion of the contralateral L5-1 foraminotomy until the left L5 nerve root was completely free.  Once this was complete, L4-5 central decompression including medial facetectomy and foraminotomy was confirmed and decompression on both sides was confirmed. No CSF leak was noted.  A Depo-Medrol soaked Gelfoam pledget was placed in the defect.  The wound was copiously irrigated. The tube system was then removed under microscopic visualization and hemostasis was obtained with a bipolar.    At this point, a second incision 3 cm lateral to the midline at the L5-S1 interspace was opened.  The fascia was opened, and the dilators  were advanced onto the L5 transverse process on the right.  The L5 transverse process and lateral pars of L5 were exposed. The tube was  repositioned and the sacral ala lateral to the L5-S1 facet was exposed.  The drill was then used to remove the lateral margin of the L5-S1 facet and the lateral pars.  The intertransvers membrane was identified and removed.  The L5 nerve root was identified.  A disc herniation deep to the nerve root was identified. This was coagulated, entered, and the extruded fragment was dissected free and removed in piecemeal fashion.  The L5 nerve root was then decompressed.    A Depo-Medrol soaked Gelfoam pledget was placed in the defect.  The wound was copiously irrigated. The tube system was then removed under microscopic visualization and hemostasis was obtained with a bipolar.     At each incision, the fascial layer was reapproximated with the use of a 0 Vicryl suture.  Subcutaneous tissue layer was reapproximated using 2-0 Vicryl suture.  3-0 monocryl was placed in subcuticular fashion. The skin was then cleansed and Dermabond was used to close the skin opening.  Patient was then rotated back to the preoperative bed awakened from anesthesia and taken to recovery all counts are correct in this case.  I performed the entire procedure with the assistance of Ivar Drape PA as an Designer, television/film set.  Ladarious Kresse K. Myer Haff MD

## 2017-07-23 NOTE — Progress Notes (Signed)
Pharmacy consulted for weight based dosing Cefazolin for surgical prophylaxis.  Based on weight < 80kg ordered Cefazolin 1 gm once. Ardyth Harps, RPh 07/23/2017

## 2017-07-23 NOTE — OR Nursing (Signed)
Interpreter present [employer]

## 2017-07-23 NOTE — H&P (Signed)
  I have reviewed and confirmed my history and physical from 07/17/17 with no additions or changes. Plan for lumbar microdiscectomy.  Risks and benefits reviewed.  Heart sounds normal no MRG. Chest Clear to Auscultation Bilaterally.

## 2017-07-24 ENCOUNTER — Encounter: Payer: Self-pay | Admitting: Neurosurgery

## 2017-07-24 DIAGNOSIS — M5116 Intervertebral disc disorders with radiculopathy, lumbar region: Secondary | ICD-10-CM | POA: Diagnosis not present

## 2017-07-24 MED ORDER — METHYLPREDNISOLONE 4 MG PO TBPK: ORAL_TABLET | ORAL | 0 refills | Status: DC

## 2017-07-24 MED ORDER — OXYCODONE HCL 5 MG PO TABS: 5.0000 mg | ORAL_TABLET | ORAL | 0 refills | Status: DC | PRN

## 2017-07-24 MED ORDER — METHOCARBAMOL 500 MG PO TABS: 500.0000 mg | ORAL_TABLET | Freq: Four times a day (QID) | ORAL | 0 refills | Status: DC | PRN

## 2017-07-24 NOTE — Care Management (Signed)
Catherine Graham with [facility] in to deliver walker and discuss /home PT with patient. She refused home health PT repeatedly. Staff plans to get interpreter to speak with patient. RNCM will follow up in the am.

## 2017-07-24 NOTE — Final Progress Note (Signed)
  History: Catherine Graham is POD2 s/p L4-5lumbar decompression including central laminectomy and bilateral medial facetectomies including foraminotomies and right L5-S1 far lateral discectomy. Patient is doing well post operatively. Symptoms prior to surgery have resolved. Patient is eating and voiding without issue. Ambulation is slow with capability of only walking short distances before having to rest. Pain is minimal, patient unable to rate.   Physical Exam: Vitals:   07/24/17 1658 07/24/17 2029 07/25/17 0433 07/25/17 0826  BP: 102/67 106/65 103/64 106/60  Pulse: (!) 58 62 (!) 59 63  Resp: Temp: 99 F (37.2 C) 98 F (36.7 C) 98.5 F (36.9 C) 98 F (36.7 C)  TempSrc: Oral Oral Oral Oral  SpO2: 96% 92% 95% 96%  Weight:      Height:        AA Ox3 CNI Strength:5/5 throughout except right quad 4/5. Sensation intact BUE, BLE Skin: incision site intact. Glue present. Mild swelling and tenderness to palpation. No erythema, warmth, or fluctuance.   Data:   Recent Labs Lab 07/21/17 1551  NA 139  K 4.1  CL 101  CO2 31  BUN 22*  CREATININE 0.50  GLUCOSE 133*  CALCIUM 9.4   No results for input(s): AST, ALT, ALKPHOS in the last 168 hours.  Invalid input(s): TBILI    Recent Labs Lab 07/21/17 1551  WBC 5.2  HGB 15.1  HCT 43.5  PLT 163    Recent Labs Lab 07/21/17 1551  APTT 32  INR 0.90         Assessment/Plan:  Catherine Graham is POD1 and doing well. Pain has been controlled with robaxin, oxycodone, and dexamethasone. Patient is experiencing right lower extremity weakness that limits her ability to ambulate for any length of distance, but she feels this is getting much better. At this time she could benefit from home health or outpatient PT for muscle strengthening, but she is declining services and believes she is capable of independent muscle conditioning. Advised patient that if she changes her mind to contact office and we can get it set up.   Continuing pain control with oxycodone, robaxin, and medrol dose pack.  Advised patient no bending, twisting of lifting greater than 10 pounds for 6 weeks.  Follow up in 2 weeks in clinic, appointment already scheduled.   Ivar Drape PA-C Department of Neurosurgery

## 2017-07-24 NOTE — Care Management Note (Signed)
Case Management Note  Patient Details  Name: Catherine Graham MRN: [id number] Date of Birth: 12-22-1955  Subjective/Objective: RNCM consult for discharge planning. PT pending. Will follow up.Discharging today.                   Action/Plan:   Expected Discharge Date:  07/24/17               Expected Discharge Plan:     In-House Referral:     Discharge planning Services  CM Consult  Post Acute Care Choice:    Choice offered to:     DME Arranged:    DME Agency:     HH Arranged:    HH Agency:     Status of Service:  In process, will continue to follow  If discussed at Long Length of Stay Meetings, dates discussed:    Additional Comments:  [name], RN 07/24/2017, 9:12 AM

## 2017-07-24 NOTE — Progress Notes (Addendum)
  History: Catherine Graham is POD1 s/p L4-5lumbar decompression including central laminectomy and bilateral medial facetectomies including foraminotomies and right L5-S1 far lateral discectomy. Patient was seen with translator present. Patient is doing well post operatively. Complains of back pain at incision site. 6/10. Symptoms prior to surgery have resolved. Patient is eating and voiding without issue, however she hasn't been able to ambulate to restroom at this time. PT evaluation revealed extreme weakness in right leg after walking very short distances with walker.   Physical Exam: Vitals:   07/24/17 0516 07/24/17 0736  BP: 117/78 119/67  Pulse: 66 69  Resp: 18   Temp: 98.4 F (36.9 C) 97.6 F (36.4 C)  SpO2: 96% 94%    AA Ox3 CNI Strength:5/5 throughout except right quad 4/5. Sensation intact BUE, BLE  Data:   Recent Labs Lab 07/21/17 1551  NA 139  K 4.1  CL 101  CO2 31  BUN 22*  CREATININE 0.50  GLUCOSE 133*  CALCIUM 9.4   No results for input(s): AST, ALT, ALKPHOS in the last 168 hours.  Invalid input(s): TBILI    Recent Labs Lab 07/21/17 1551  WBC 5.2  HGB 15.1  HCT 43.5  PLT 163    Recent Labs Lab 07/21/17 1551  APTT 32  INR 0.90         Assessment/Plan:  Catherine Graham is POD1 and doing well. Pain is moderately controlled with robaxin, oxycodone, and dexamethasone. PT evaluation revealed extreme right leg weakness with ambulation. We will continue to monitor and reevaluate strength tomorrow. In the meantime, continue PT for strength improvement.  Language Resources were used for interpretation - Thressa Sheller [phone]   Ivar Drape PA-C Department of Neurosurgery

## 2017-07-24 NOTE — Care Management Note (Signed)
Case Management Note  Patient Details  Name: Catherine Graham MRN: [id number] Date of Birth: 08/25/1956  Subjective/Objective:  PT recommending home health PT. Tc to son Reola Calkins. Further explained PT recommendations. He is agreeable with no agency preference. Referral to [facility] for HHPT. Ordered walker from [facility] as well. Patient lives at home with her spouse who can assist her as needed. Patient was not using DME prior to admission. PCP is Jerl Mina.                      Action/Plan: [facility] for HHPT and walker.  Expected Discharge Date:  07/24/17               Expected Discharge Plan:  Home w Home Health Services  In-House Referral:     Discharge planning Services  CM Consult  Post Acute Care Choice:  Home Health, Durable Medical Equipment Choice offered to:  Adult Children  DME Arranged:  Walker rolling DME Agency:  [facility]  HH Arranged:  PT HH Agency:  [facility]  Status of Service:  In process, will continue to follow  If discussed at Long Length of Stay Meetings, dates discussed:    Additional Comments:  [name], RN 07/24/2017, 2:14 PM

## 2017-07-24 NOTE — Evaluation (Signed)
Occupational Therapy Evaluation Patient Details Name: Catherine Graham MRN: [id number] DOB: Jun 23, 1956 Today's Date: 07/24/2017    History of Present Illness s/p lumbar decompression L4-L5    Clinical Impression   Pt is 61 year old female s/p lumbar decompression of L4-5 and lateral discectomy L5-S1.  Pt was independent in all ADLs prior to surgery with occasional help from her husband with pants over feet and shoes when pain level was high and is eager to return to PLOF.  Pt currently requires min assist for LB dressing while in seated position due to pain and restrictions for no bending or twisting.  She does not require a back brace at this time.  Pt was able to state precautions prior to education in LB dressing with practice with use of reacher and sock aid. Supervision and min cues due to impulsivity and rec supervision at home for first 7-10 days. Pt would benefit from instruction in dressing techniques with or without assistive devices for dressing and bathing skills.  Pt would also benefit from recommendations for home modifications to increase safety in the bathroom and prevent falls. Rec use of a transfer tub bench or shower chair to prevent falls at home when bathing.  No further OT recommended.  Pt provided adaptive equipment catalog and reviewed rec items.    Follow Up Recommendations  No OT follow up    Equipment Recommendations  Other (comment);Tub/shower bench Lexicographer )    Recommendations for Other Services       Precautions / Restrictions Precautions Precautions: Fall;Back Precaution Booklet Issued: Yes (comment) Restrictions Weight Bearing Restrictions: No Other Position/Activity Restrictions: No bending, arching, or twisting      Mobility Bed Mobility Overal bed mobility: Needs Assistance Bed Mobility: Supine to Sit;Sit to Supine     Supine to sit: Modified independent (Device/Increase time) Sit to supine: Min guard   General bed mobility comments: Pt able to  roll onto R side, move LEs off bed and push into seated at EOB utilizing bed rail and pushing into bed.  Pt able to perform with safe, proper technique, no cues needed.  Transfers Overall transfer level: Needs assistance Equipment used: Rolling walker (2 wheeled) Transfers: Sit to/from Stand Sit to Stand: Supervision         General transfer comment: Pt able to rise from seated EOB to standing with proper technique, no cues needed. Pt requires additional time due to weak RLE.     Balance Overall balance assessment: Needs assistance Sitting-balance support: Feet unsupported Sitting balance-Leahy Scale: Good Sitting balance - Comments: Pt able to sit at EOB with no assist   Standing balance support: Bilateral upper extremity supported Standing balance-Leahy Scale: Good Standing balance comment: Pt able to stand with RW with CGA, however fatigues easily due to decreased RLE strength/endurance                            ADL either performed or assessed with clinical judgement   ADL Overall ADL's : Needs assistance/impaired Eating/Feeding: Independent;Set up   Grooming: Wash/dry face;Wash/dry hands;Oral care;Brushing hair;Independent;Set up           Upper Body Dressing : Independent;Set up   Lower Body Dressing: Min guard;Set up;With adaptive equipment;Sit to/from stand Lower Body Dressing Details (indicate cue type and reason): cues for impulsivity when using reacher and sock aid and when ambulating to bathroom and pushed walker into linen bag which was moved to allow more space and  prevent fall. Toilet Transfer: Supervision/safety;Set up;Grab bars;Cueing for safety   Toileting- Clothing Manipulation and Hygiene: Supervision/safety         General ADL Comments: Recommend use of reacher at home for LB dressing skills.  Pt stated she does not wear socks since she always wears slip on sandals.  Rec she wear slip on shoes with rubber soles and not flip flops.      Vision Patient Visual Report: No change from baseline       Perception     Praxis      Pertinent Vitals/Pain Pain Assessment: 0-10 Pain Score: 4  Pain Location: sole of R foot Pain Descriptors / Indicators: Sore Pain Intervention(s): Limited activity within patient's tolerance;Monitored during session;Premedicated before session     Hand Dominance Right   Extremity/Trunk Assessment Upper Extremity Assessment Upper Extremity Assessment: Generalized weakness   Lower Extremity Assessment Lower Extremity Assessment: Defer to PT evaluation RLE Deficits / Details: LE MMT, grossly R 3/5 and L 4/5   Cervical / Trunk Assessment Cervical / Trunk Assessment: Normal   Communication Communication Communication: No difficulties;Other (comment) (Pt stated she did not need interpretor by phone before therapist asked)   Cognition Arousal/Alertness: Awake/alert Behavior During Therapy: WFL for tasks assessed/performed;Impulsive (cues to slow down when ambulating to bathroom) Overall Cognitive Status: Within Functional Limits for tasks assessed                                     General Comments       Exercises Exercises: Other exercises Other Exercises Other Exercises: Supine ther-ex 10x, B ankle pumps, B SLRs, B hip abd/add. Pt able to perform with proper technique, however required min assist to complete R SLRs.    Shoulder Instructions      Home Living Family/patient expects to be discharged to:: Private residence Living Arrangements: Spouse/significant other Available Help at Discharge: Family;Available 24 hours/day Type of Home: House Home Access: Stairs to enter [employer] of Steps: 1 step with no stairs at rear entrance of home, 5 steps no rail in front of home Entrance Stairs-Rails: None Home Layout: Two level Alternate Level Stairs-Number of Steps: 12   Bathroom Shower/Tub: Tub/shower unit;Door   [employer] Toilet: Academic librarian: No   Home Equipment: None          Prior Functioning/Environment Level of Independence: Needs assistance    ADL's / Homemaking Assistance Needed: Pt needed help from her husband for shoes and pants over feet when lower back pain was bad.     Comments: Pt uses office chair with rolling wheels for stability when amb around home, spouse assists pt with most activities around the home. Pt appears to walk only household distances, requires breaks to amb and perform stairs.         OT Problem List: Decreased strength;Decreased range of motion;Decreased activity tolerance;Pain      OT Treatment/Interventions: Self-care/ADL training;DME and/or AE instruction;Patient/family education    OT Goals(Current goals can be found in the care plan section) Acute Rehab OT Goals Patient Stated Goal: to return home OT Goal Formulation: With patient Time For Goal Achievement: 08/07/17 Potential to Achieve Goals: Good ADL Goals Pt Will Perform Lower Body Dressing: with supervision;with adaptive equipment;sit to/from stand (with no cues for impulsivity using FWW when standing) Pt Will Transfer to Toilet: with set-up;with supervision;stand pivot transfer;regular height toilet (with no LOB w/o using grab bar)  OT Frequency: Min 1X/week   Barriers to D/C:            Co-evaluation              AM-PAC PT "6 Clicks" Daily Activity     Outcome Measure Help from another person eating meals?: None Help from another person taking care of personal grooming?: None Help from another person toileting, which includes using toliet, bedpan, or urinal?: A Little Help from another person bathing (including washing, rinsing, drying)?: A Little Help from another person to put on and taking off regular upper body clothing?: None Help from another person to put on and taking off regular lower body clothing?: A Little 6 Click Score: 21   End of Session Equipment Utilized During Treatment: Gait  belt Nurse Communication: Other (comment) (pt urinated in toilet)  Activity Tolerance: Patient tolerated treatment well Patient left: in bed;with call bell/phone within reach;with bed alarm set  OT Visit Diagnosis: Pain;Muscle weakness (generalized) (M62.81) Pain - Right/Left: Right Pain - part of body: Ankle and joints of foot                Time: 1350-1420 OT Time Calculation (min): 30 min Charges:  OT General Charges $OT Visit: 1 Visit OT Evaluation $OT Eval Low Complexity: 1 Low OT Treatments $Self Care/Home Management : 8-22 mins G-Codes: OT G-codes **NOT FOR INPATIENT CLASS** Functional Assessment Tool Used: AM-PAC 6 Clicks Daily Activity Functional Limitation: Self care Self Care Current Status (Z6109): At least 1 percent but less than 20 percent impaired, limited or restricted Self Care Goal Status (U0454): At least 1 percent but less than 20 percent impaired, limited or restricted   Susanne Borders, OTR/L ascom [phone] 07/24/17, 2:39 PM   Catherine Graham 07/24/2017, 2:33 PM

## 2017-07-24 NOTE — Progress Notes (Signed)
Patient's legs very weak and giving out with ambulation assist. 2 person max assist with walker to assist to [facility] and back to bed.

## 2017-07-24 NOTE — Progress Notes (Signed)
Nursing attempted to ambulate patient again. Legs remain very weak and giving out, max  2 person assist with walker to [facility] and back to bed. Patient was also hitting herself hard in the mid back, educated patient to not do this, scheduled tylenol given for pain.

## 2017-07-24 NOTE — Discharge Instructions (Signed)

## 2017-07-24 NOTE — Care Management Obs Status (Signed)
MEDICARE OBSERVATION STATUS NOTIFICATION   Patient Details  Name: Catherine Graham MRN: [id number] Date of Birth: 11/18/1955   Medicare Observation Status Notification Given:  Yes Delivered copy. Patient and son did not want to sign.     [name], RN 07/24/2017, 9:32 AM

## 2017-07-24 NOTE — Progress Notes (Signed)
Physical Therapy Evaluation Patient Details Name: Catherine Graham MRN: [id number] DOB: [birth year] Today's Date: 07/24/2017   History of Present Illness  s/p lumbar decompression L4-L5 including central laminectomy and B medial facetectomies including foraminectomies and R L5-S1 far lateral discectomy   Clinical Impression  Pt is a pleasant 61 year female who was admitted for a L4-L5 lumbar decompression. Pt performed supine there-ex, bed mobility with mod. I, transfers with supervision, amb with RW with min assist and stairs with min assist. Pt demonstrates deficits with RLE strength/endurance, transfers, amb and stairs. Pt able to amb 10 ft at a time, however due to fatigue pt's RLE will buckle and pt will require a break in a chair or in standing with RW.  Pt able to go up/down 1 step backwards with RW with significant effort and cues, at this time pt is not able to safely amb on stairs facing forward. Pt appears motivated to participate in PT however is not aware of her physical limits. Discussed written HEP with pt as pt expressed interested in continuing exercises. Pt's primary language is [place], phone interpreter was available but not needed. Pt would benefit from skilled PT to address above deficits and promote optimal return to home. Recommend transition to HHPT upon DC from acute hospitalization. This entire session was guided, instructed, and directly supervised by Elizabeth Palau, DPT.    Follow Up Recommendations Home health PT, with 24 hour assistance    Equipment Recommendations  Rolling walker with 5" wheels    Recommendations for Other Services       Precautions / Restrictions Precautions Precautions: Fall;Back Precaution Booklet Issued: Yes (comment) Restrictions Weight Bearing Restrictions: No Other Position/Activity Restrictions: No bending, arching, or twisting      Mobility  Bed Mobility Overal bed mobility: Needs Assistance Bed Mobility: Supine to Sit;Sit to  Supine     Supine to sit: Modified independent (Device/Increase time) Sit to supine: Min guard   General bed mobility comments: Pt able to roll onto R side, move LEs off bed and push into seated at EOB utilizing bed rail and pushing into bed.  Pt able to perform with safe, proper technique, no cues needed.  Transfers Overall transfer level: Needs assistance Equipment used: Rolling walker (2 wheeled) Transfers: Sit to/from Stand Sit to Stand: Supervision         General transfer comment: Pt able to rise from seated EOB to standing with proper technique, no cues needed. Pt requires additional time due to weak RLE.   Ambulation/Gait Ambulation/Gait assistance: Min assist Ambulation Distance (Feet): 50 Feet Assistive device: Rolling walker (2 wheeled) Gait Pattern/deviations: Step-to pattern   Gait velocity interpretation:  General Gait Details: Pt required continuous cues for proper sequencing of RW and feet during amb. Pt took short steps, unable to take longer steps without quality of steps decreasing. Pt required several standing breaks to rest, however did not need to rest in a chair.  After amb 10 ft, pt would fatigue and RLE would start to buckle, pt required a rest break.   Stairs Stairs: Yes Stairs assistance: Min assist Stair Management: No rails;Backwards;With walker Number of Stairs: 1 General stair comments: Pt able to go up/down 1 step backwards using RW, practiced twice. Provided cues for proper sequencing, carryover with second attempt. Pt able to maintain balance and control. Pt did not have the LE strength/endurance to safely perform stairs forward.   Wheelchair Mobility    Modified Rankin (Stroke Patients Only)  Balance Overall balance assessment: Needs assistance Sitting-balance support: Feet unsupported Sitting balance-Leahy Scale: Good Sitting balance - Comments: Pt able to sit at EOB with no assist   Standing balance support: Bilateral upper  extremity supported Standing balance-Leahy Scale: Good Standing balance comment: Pt able to stand with RW with CGA, however fatigues easily due to decreased RLE strength/endurance                              Pertinent Vitals/Pain Pain Assessment: 0-10 Pain Score: 6  Pain Location: R LB/hip and R sole of foot Pain Descriptors / Indicators: Sore Pain Intervention(s): Limited activity within patient's tolerance;Monitored during session;Repositioned    Home Living Family/patient expects to be discharged to:: Private residence Living Arrangements: Spouse/significant other Available Help at Discharge: Family;Available 24 hours/day Type of Home: House Home Access: Stairs to enter Entrance Stairs-Rails: None Entrance Stairs-Number of Steps: 1 step with no stairs at rear entrance of home, 5 steps no rail in front of home Home Layout: Two level Home Equipment: None      Prior Function Level of Independence: Needs assistance      ADL's / Homemaking Assistance Needed:   Comments: Pt uses office chair with rolling wheels for stability when amb around home, spouse assists pt with most activities around the home. Pt appears to walk only household distances, requires breaks to amb and perform stairs.      Hand Dominance   Dominant Hand:     Extremity/Trunk Assessment   Upper Extremity Assessment Upper Extremity Assessment: Generalized weakness, UE MMT grossly 4/5    Lower Extremity Assessment Lower Extremity Assessment:  RLE Deficits / Details: LE MMT, grossly R 3/5 and L 4/5    Cervical / Trunk Assessment Cervical / Trunk Assessment: Normal  Communication   Communication: No difficulties;Other (comment) No difficulties;Prefers language other than [place];Interpreter utilized;Other (comment) Able to communicate in English, interpreter not needed  Cognition Arousal/Alertness: Awake/alert Behavior During Therapy: [facility] for tasks assessed/performed; Overall Cognitive  Status: Within Functional Limits for tasks assessed                                        General Comments      Exercises Other Exercises Other Exercises: Supine ther-ex 10x, B ankle pumps, B SLRs, B hip abd/add. Pt able to perform with proper technique, however required min assist to complete R SLRs.    Assessment/Plan    PT Assessment Patient needs continued PT services  PT Problem List Decreased strength;Decreased range of motion;Decreased activity tolerance;Decreased mobility;Decreased knowledge of use of DME;Decreased knowledge of precautions;Pain       PT Treatment Interventions DME instruction;Gait training;Stair training;Therapeutic activities;Therapeutic exercise;Patient/family education    PT Goals (Current goals can be found in the Care Plan section)  Acute Rehab PT Goals Patient Stated Goal: to return home PT Goal Formulation: With patient Time For Goal Achievement: 08/07/17 Potential to Achieve Goals: Good Additional Goals Additional Goal #1: Pt will increase LE strength/endurance to safely amb in home with min rest breaks    Frequency BID   Barriers to discharge        Co-evaluation               AM-PAC PT "6 Clicks" Daily Activity  Outcome Measure Difficulty turning over in bed (including adjusting bedclothes, sheets and blankets)?: None Difficulty moving from  lying on back to sitting on the side of the bed? : None Difficulty sitting down on and standing up from a chair with arms (e.g., wheelchair, bedside commode, etc,.)?: Unable Help needed moving to and from a bed to chair (including a wheelchair)?: A Lot Help needed walking in hospital room?: A Lot Help needed climbing 3-5 steps with a railing? : A Lot 6 Click Score: 15    End of Session Equipment Utilized During Treatment: Gait belt Activity Tolerance: Patient tolerated treatment well;Patient limited by fatigue;Patient limited by pain Patient left: in chair;with chair alarm  set;with call bell/phone within reach; Nurse Communication: Mobility status PT Visit Diagnosis: Unsteadiness on feet (R26.81);Other abnormalities of gait and mobility (R26.89);Muscle weakness (generalized) (M62.81);Difficulty in walking, not elsewhere classified (R26.2);Pain Pain - Right/Left: Right Pain - part of body:  (LB)    Time: 1610-9604 PT Time Calculation (min) (ACUTE ONLY): 38 min   Charges:   PT Evaluation $PT Eval Moderate Complexity: 1 Mod PT Treatments $Gait Training: 8-22 mins $Therapeutic Exercise: 8-22 mins   PT G Codes:   PT G-Codes **NOT FOR INPATIENT CLASS** Functional Assessment Tool Used: AM-PAC 6 Clicks Basic Mobility Functional Limitation: Mobility: Walking and moving around Mobility: Walking and Moving Around Current Status (V4098): At least 40 percent but less than 60 percent impaired, limited or restricted Mobility: Walking and Moving Around Goal Status [phone]): At least 20 percent but less than 40 percent impaired, limited or restricted   Renford Dills, SPT  Catherine Graham 07/24/2017, 3:00 PM

## 2017-07-24 NOTE — Clinical Social Work Note (Signed)
CSW received referral for SNF.  Case discussed with case manager and plan is to discharge home with home health.  CSW to sign off please re-consult if social work needs arise.  Al Gagen R. Kimala Horne, MSW, LCSWA [phone]  

## 2017-07-24 NOTE — Progress Notes (Signed)
Physical Therapy Treatment Patient Details Name: Catherine Graham MRN: [id number] DOB: 1955/11/22 Today's Date: 07/24/2017    History of Present Illness s/p lumbar decompression L4-L5     PT Comments    Participated in exercises as described below.  Pt stated she walked to/from bathroom with nursing earlier.  Agrees to gait this attempt.  Bed mobility without assist.  Pt was able to ambulate 5' on first attempt before fatigue when she grabbed front of walker for support.  Educated on safety.  On second attempt was able to increase distance to 12' before cues to sit by Clinical research associate.  On third attempt, she took 3 steps before buckling and unable to control needing max a x 1 to guide to wheelchair follow.  After rest, was able to transfer to bed with min guard.     Follow Up Recommendations  Home health PT     Equipment Recommendations  Rolling walker with 5" wheels    Recommendations for Other Services       Precautions / Restrictions Precautions Precautions: Fall;Back Precaution Booklet Issued: Yes (comment) Restrictions Weight Bearing Restrictions: No Other Position/Activity Restrictions: No bending, arching, or twisting    Mobility  Bed Mobility Overal bed mobility: Needs Assistance Bed Mobility: Supine to Sit;Sit to Supine     Supine to sit: Modified independent (Device/Increase time) Sit to supine: Min guard   General bed mobility comments: Pt able to roll onto R side, move LEs off bed and push into seated at EOB utilizing bed rail and pushing into bed.  Pt able to perform with safe, proper technique, no cues needed.  Transfers Overall transfer level: Needs assistance Equipment used: Rolling walker (2 wheeled) Transfers: Sit to/from Stand Sit to Stand: Supervision         General transfer comment: Pt able to rise from seated EOB to standing with proper technique, no cues needed. Pt requires additional time due to weak RLE.   Ambulation/Gait Ambulation/Gait assistance:  Min assist Ambulation Distance (Feet): 12 Feet Assistive device: Rolling walker (2 wheeled) Gait Pattern/deviations: Step-to pattern   Gait velocity interpretation: <1.8 ft/sec, indicative of risk for recurrent falls General Gait Details: RLE buckles when fatigued, poor safety when fatigued   Stairs Stairs: Yes   Stair Management: No rails;Backwards;With walker Number of Stairs: 1 General stair comments: Pt able to go up/down 1 step backwards using RW, practiced twice. Provided cues for proper sequencing, carryover with second attempt. Pt able to maintain balance and control. Pt did not have the LE strength/endurance to safely perform stairs forward.   Wheelchair Mobility    Modified Rankin (Stroke Patients Only)       Balance Overall balance assessment: Needs assistance Sitting-balance support: Feet unsupported Sitting balance-Leahy Scale: Good Sitting balance - Comments: Pt able to sit at EOB with no assist   Standing balance support: Bilateral upper extremity supported Standing balance-Leahy Scale: Good Standing balance comment: Pt able to stand with RW with CGA, however fatigues easily due to decreased RLE strength/endurance                             Cognition Arousal/Alertness: Awake/alert Behavior During Therapy: WFL for tasks assessed/performed Overall Cognitive Status: Within Functional Limits for tasks assessed  Exercises Other Exercises Other Exercises: Supine ther-ex 10x, B ankle pumps, B SLRs, B hip abd/add. Pt able to perform with proper technique, however required min assist to complete R SLRs.     General Comments        Pertinent Vitals/Pain Pain Assessment: 0-10 Pain Score: 5  Pain Location: R LB/hip and sole of R foot Pain Descriptors / Indicators: Sore Pain Intervention(s): Limited activity within patient's tolerance    Home Living Family/patient expects to be discharged to::  Private residence Living Arrangements: Spouse/significant other Available Help at Discharge: Family;Available 24 hours/day Type of Home: House Home Access: Stairs to enter Entrance Stairs-Rails: None Home Layout: Two level (able to stay on first floor) Home Equipment: None      Prior Function Level of Independence: Needs assistance      Comments: Pt uses office chair with rolling wheels for stability when amb around home, spouse assists pt with most activities around the home. Pt appears to walk only household distances, requires breaks to amb and perform stairs.    PT Goals (current goals can now be found in the care plan section) Acute Rehab PT Goals Patient Stated Goal: to return home PT Goal Formulation: With patient Time For Goal Achievement: 08/07/17 Potential to Achieve Goals: Good Additional Goals Additional Goal #1: Pt will increase LE strength/endurance to safely amb in home with min rest breaks Progress towards PT goals: Progressing toward goals    Frequency    BID      PT Plan Current plan remains appropriate    Co-evaluation              AM-PAC PT "6 Clicks" Daily Activity  Outcome Measure  Difficulty turning over in bed (including adjusting bedclothes, sheets and blankets)?: None Difficulty moving from lying on back to sitting on the side of the bed? : None Difficulty sitting down on and standing up from a chair with arms (e.g., wheelchair, bedside commode, etc,.)?: A Little Help needed moving to and from a bed to chair (including a wheelchair)?: A Lot Help needed walking in hospital room?: A Lot Help needed climbing 3-5 steps with a railing? : A Lot 6 Click Score: 17    End of Session Equipment Utilized During Treatment: Gait belt Activity Tolerance: Patient tolerated treatment well;Patient limited by fatigue;Patient limited by pain Patient left: in bed;with bed alarm set;with call bell/phone within reach;with family/visitor present;with SCD's  reapplied Nurse Communication: Mobility status PT Visit Diagnosis: Unsteadiness on feet (R26.81);Other abnormalities of gait and mobility (R26.89);Muscle weakness (generalized) (M62.81);Difficulty in walking, not elsewhere classified (R26.2);Pain Pain - Right/Left: Right Pain - part of body:  (LB)     Time: 1610-9604 PT Time Calculation (min) (ACUTE ONLY): 15 min  Charges:  $Gait Training: 8-22 mins                    G Codes:  Functional Assessment Tool Used: AM-PAC 6 Clicks Basic Mobility Functional Limitation: Mobility: Walking and moving around Mobility: Walking and Moving Around Current Status (V4098): At least 40 percent but less than 60 percent impaired, limited or restricted Mobility: Walking and Moving Around Goal Status [phone]): At least 20 percent but less than 40 percent impaired, limited or restricted    Danielle Dess, PTA 07/24/17, 12:53 PM

## 2017-07-25 DIAGNOSIS — M5116 Intervertebral disc disorders with radiculopathy, lumbar region: Secondary | ICD-10-CM | POA: Diagnosis not present

## 2017-07-25 NOTE — Care Management Note (Addendum)
Case Management Note  Patient Details  Name: Charletta Voight MRN: [id number] Date of Birth: [birth year]  Subjective/Objective:  [facility] to follow up with patient following dishcarge                  Action/Plan: Home health has been cancelled by MD. Patient refusing   Expected Discharge Date:  07/25/17               Expected Discharge Plan:  Home w Home Health Services  In-House Referral:     Discharge planning Services  CM Consult  Post Acute Care Choice:  Home Health, Durable Medical Equipment Choice offered to:  Adult Children  DME Arranged:  Walker rolling DME Agency:  [facility]  HH Arranged:  PT HH Agency:  [facility]  Status of Service:  Completed, signed off  If discussed at Long Length of Stay Meetings, dates discussed:    Additional Comments:  [name], RN 07/25/2017, 10:00 AM

## 2017-07-25 NOTE — Progress Notes (Signed)
Physical Therapy Treatment Patient Details Name: Catherine Graham MRN: [id number] DOB: Oct 30, 1956 Today's Date: 07/25/2017    History of Present Illness s/p lumbar decompression L4-L5 including central laminectomy and B medial facetectomies     PT Comments    Pt is making good progress towards goals. Pt performed supine there-ex, bed mobility with mod. I, transfers/amb with RW with CGA. Pt amb a total of 130 ft, amb from bed to toilet, to nurses' station, and back to bed with WC follow. Pt able to amb 60 ft before requiring a seated break, able to communicate when she becomes fatigued and needs to sit. Towards the end, noted shuffling gait pattern, however pt able to self-correct with cues for technique, no RLE buckling. Pt continues to demonstrate deficits with RLE strength and endurance when amb. Pt reports she has been performing written HEP regularly throughout the day, and appears very motivated to participate in PT. Pt expressed disinterest in HHPT and outpatient PT services, says husband is available to help, however discussed importance of continuing PT and pt appeared willing to give HHPT a try.     Follow Up Recommendations  Home health PT     Equipment Recommendations  Rolling walker with 5" wheels    Recommendations for Other Services       Precautions / Restrictions Precautions Precautions: Fall;Back Precaution Booklet Issued: Yes (comment) Restrictions Weight Bearing Restrictions: No Other Position/Activity Restrictions: No bending, arching, or twisting    Mobility  Bed Mobility Overal bed mobility: Modified Independent Bed Mobility: Supine to Sit     Supine to sit: Modified independent (Device/Increase time) Sit to supine: Modified independent (Device/Increase time)   General bed mobility comments: Pt able to roll onto R side and rise to seated at EOB utilizing bed rail with safe proper technique, no cues needed.   Transfers Overall transfer level: Needs  assistance Equipment used: Rolling walker (2 wheeled) Transfers: Sit to/from Stand Sit to Stand: Supervision         General transfer comment: Pt able to rise from seated EOB to standing with proper technique, no cues needed. Does not require additional time. Pt able to transfer to/from toilet with supervision.   Ambulation/Gait Ambulation/Gait assistance: Min assist Ambulation Distance (Feet): 130 Feet Assistive device: Rolling walker (2 wheeled) Gait Pattern/deviations: Step-to pattern;Shuffle     General Gait Details: Pt amb with step-to pattern with proper sequencing of RW and LEs. Pt amb with small steps, no noted RLE buckling. Towards the end of the session noted feet shuffling, cued to lift feets and amb with heel to toe pattern. Pt able to readjust gait. Pt able to amb 90 feet before requiring a break, took a second break when returning to room.    Stairs            Wheelchair Mobility    Modified Rankin (Stroke Patients Only)       Balance Overall balance assessment: Needs assistance Sitting-balance support: Feet supported Sitting balance-Leahy Scale: Good Sitting balance - Comments: Pt able to sit at EOB with no assist   Standing balance support: Bilateral upper extremity supported Standing balance-Leahy Scale: Good Standing balance comment: Pt able to stand with RW with CGA at EOB and in front of toilet, pt able to perform toileting tasks and wash both hands at sink                            Cognition Arousal/Alertness: Awake/alert Behavior  During Therapy: WFL for tasks assessed/performed Overall Cognitive Status: Within Functional Limits for tasks assessed                                        Exercises Other Exercises Other Exercises: Supine ther-ex 15x, R ankle pumps, SLRs, hip abd/add, quad sets, SAQs, hip add pillow squeeze, heel slides, glute sets. Pt able to perform with proper technique, provided cues to clarify  proper technique Other Exercises: Pt amb to bathroom with RW with CGA, able to perform toileting duties with supervision, able to wash both hands at sink.    General Comments        Pertinent Vitals/Pain Pain Assessment: 0-10 Pain Score: 5  Pain Location: R LB Pain Descriptors / Indicators: Sore Pain Intervention(s): Limited activity within patient's tolerance;Monitored during session;Repositioned    Home Living                      Prior Function            PT Goals (current goals can now be found in the care plan section) Acute Rehab PT Goals Patient Stated Goal: to return home PT Goal Formulation: With patient Time For Goal Achievement: 08/07/17 Potential to Achieve Goals: Good Progress towards PT goals: Progressing toward goals    Frequency    BID      PT Plan Current plan remains appropriate    Co-evaluation              AM-PAC PT "6 Clicks" Daily Activity  Outcome Measure  Difficulty turning over in bed (including adjusting bedclothes, sheets and blankets)?: None Difficulty moving from lying on back to sitting on the side of the bed? : None Difficulty sitting down on and standing up from a chair with arms (e.g., wheelchair, bedside commode, etc,.)?: None Help needed moving to and from a bed to chair (including a wheelchair)?: A Little Help needed walking in hospital room?: A Lot Help needed climbing 3-5 steps with a railing? : A Lot 6 Click Score: 19    End of Session Equipment Utilized During Treatment: Gait belt Activity Tolerance: Patient tolerated treatment well Patient left: in bed;with call bell/phone within reach;with bed alarm set Nurse Communication: Mobility status PT Visit Diagnosis: Other abnormalities of gait and mobility (R26.89);Muscle weakness (generalized) (M62.81);Difficulty in walking, not elsewhere classified (R26.2);Pain Pain - Right/Left: Right Pain - part of body:  (LB)     Time: 4782-9562 PT Time Calculation  (min) (ACUTE ONLY): 38 min  Charges:                       G Codes:  Functional Assessment Tool Used: AM-PAC 6 Clicks Basic Mobility Functional Limitation: Mobility: Walking and moving around Mobility: Walking and Moving Around Current Status (Z3086): At least 20 percent but less than 40 percent impaired, limited or restricted Mobility: Walking and Moving Around Goal Status [phone]): At least 1 percent but less than 20 percent impaired, limited or restricted    Renford Dills, SPT   Renford Dills 07/25/2017, 11:51 AM

## 2017-07-25 NOTE — Care Management (Signed)
[facility] called patient and attempted home care visit. Patient declined.

## 2017-07-28 NOTE — Discharge Summary (Signed)
History: Catherine Graham is POD2 s/p L4-5lumbar decompression including central laminectomy and bilateral medial facetectomies including foraminotomies and right L5-S1 far lateral discectomy. Patient is doing well post operatively. Symptoms prior to surgery have resolved. Patient is eating and voiding without issue. Ambulation is slow with capability of only walking short distances before having to rest. Pain is minimal, patient unable to rate.   Physical Exam:       Vitals:   07/24/17 1658 07/24/17 2029 07/25/17 0433 07/25/17 0826  BP: 102/67 106/65 103/64 106/60  Pulse: (!) 58 62 (!) 59 63  Resp: Temp: 99 F (37.2 C) 98 F (36.7 C) 98.5 F (36.9 C) 98 F (36.7 C)  TempSrc: Oral Oral Oral Oral  SpO2: 96% 92% 95% 96%  Weight:      Height:        AA Ox3 CNI Strength:5/5 throughout except right quad 4/5. Sensation intact BUE, BLE Skin: incision site intact. Glue present. Mild swelling and tenderness to palpation. No erythema, warmth, or fluctuance.   Data:   Last Labs    Recent Labs Lab 07/21/17 1551  NA 139  K 4.1  CL 101  CO2 31  BUN 22*  CREATININE 0.50  GLUCOSE 133*  CALCIUM 9.4      Last Labs   No results for input(s): AST, ALT, ALKPHOS in the last 168 hours.  Invalid input(s): TBILI      Last Labs    Recent Labs Lab 07/21/17 1551  WBC 5.2  HGB 15.1  HCT 43.5  PLT 163      Last Labs    Recent Labs Lab 07/21/17 1551  APTT 32  INR 0.90           Assessment/Plan:  Catherine Graham is POD1 and doing well. Pain has been controlled with robaxin, oxycodone, and dexamethasone. Patient is experiencing right lower extremity weakness that limits her ability to ambulate for any length of distance, but she feels this is getting much better. At this time she could benefit from home health or outpatient PT for muscle strengthening, but she is declining services and believes she is capable of independent muscle  conditioning. Advised patient that if she changes her mind to contact office and we can get it set up.  Continuing pain control with oxycodone, robaxin, and medrol dose pack.  Advised patient no bending, twisting of lifting greater than 10 pounds for 6 weeks.  Follow up in 2 weeks in clinic, appointment already scheduled.   Ivar Drape PA-C Department of Neurosurgery

## 2019-07-21 ENCOUNTER — Other Ambulatory Visit: Payer: Self-pay | Admitting: Student

## 2019-07-21 DIAGNOSIS — R29898 Other symptoms and signs involving the musculoskeletal system: Secondary | ICD-10-CM

## 2019-08-02 ENCOUNTER — Other Ambulatory Visit: Payer: Self-pay

## 2019-08-02 ENCOUNTER — Ambulatory Visit
Admission: RE | Admit: 2019-08-02 | Discharge: 2019-08-02 | Disposition: A | Discharge status: Home / Self Care | Payer: BC Managed Care – PPO | Source: Ambulatory Visit | Attending: Student | Admitting: Student

## 2019-08-02 DIAGNOSIS — R29898 Other symptoms and signs involving the musculoskeletal system: Secondary | ICD-10-CM | POA: Diagnosis present

## 2019-09-03 ENCOUNTER — Other Ambulatory Visit: Payer: Self-pay | Admitting: Student

## 2019-09-03 DIAGNOSIS — M92211 Osteochondrosis (juvenile) of carpal lunate [Kienbock], right hand: Secondary | ICD-10-CM

## 2019-09-08 ENCOUNTER — Other Ambulatory Visit: Payer: Self-pay | Admitting: Neurology

## 2019-09-08 DIAGNOSIS — R29898 Other symptoms and signs involving the musculoskeletal system: Secondary | ICD-10-CM

## 2019-09-20 ENCOUNTER — Ambulatory Visit: Payer: BC Managed Care – PPO

## 2019-09-22 ENCOUNTER — Ambulatory Visit: Payer: BC Managed Care – PPO

## 2019-09-23 ENCOUNTER — Other Ambulatory Visit: Payer: Self-pay

## 2019-09-23 ENCOUNTER — Ambulatory Visit
Admission: RE | Admit: 2019-09-23 | Discharge: 2019-09-23 | Disposition: A | Discharge status: Home / Self Care | Payer: BC Managed Care – PPO | Source: Ambulatory Visit | Attending: Student | Admitting: Student

## 2019-09-23 ENCOUNTER — Ambulatory Visit
Admission: RE | Admit: 2019-09-23 | Discharge: 2019-09-23 | Disposition: A | Discharge status: Home / Self Care | Payer: BC Managed Care – PPO | Source: Ambulatory Visit | Attending: Neurology | Admitting: Neurology

## 2019-09-23 DIAGNOSIS — M92211 Osteochondrosis (juvenile) of carpal lunate [Kienbock], right hand: Secondary | ICD-10-CM

## 2019-09-23 DIAGNOSIS — R29898 Other symptoms and signs involving the musculoskeletal system: Secondary | ICD-10-CM | POA: Diagnosis not present

## 2020-09-13 ENCOUNTER — Emergency Department: Payer: BC Managed Care – PPO

## 2020-09-13 ENCOUNTER — Other Ambulatory Visit: Payer: Self-pay

## 2020-09-13 ENCOUNTER — Emergency Department
Admission: EM | Admit: 2020-09-13 | Discharge: 2020-09-13 | Disposition: A | Discharge status: Hospice | Payer: BC Managed Care – PPO | Attending: Emergency Medicine | Admitting: Emergency Medicine

## 2020-09-13 DIAGNOSIS — I10 Essential (primary) hypertension: Secondary | ICD-10-CM | POA: Insufficient documentation

## 2020-09-13 DIAGNOSIS — R4182 Altered mental status, unspecified: Secondary | ICD-10-CM | POA: Diagnosis present

## 2020-09-13 DIAGNOSIS — G1221 Amyotrophic lateral sclerosis: Secondary | ICD-10-CM | POA: Insufficient documentation

## 2020-09-13 LAB — URINALYSIS, COMPLETE (UACMP) WITH MICROSCOPIC
Bacteria, UA: NONE SEEN
Bilirubin Urine: NEGATIVE
Glucose, UA: NEGATIVE mg/dL
Hgb urine dipstick: NEGATIVE
Ketones, ur: NEGATIVE mg/dL
Leukocytes,Ua: NEGATIVE
Nitrite: NEGATIVE
Protein, ur: NEGATIVE mg/dL
Specific Gravity, Urine: 1.015 (ref 1.005–1.030)
pH: 6 (ref 5.0–8.0)

## 2020-09-13 LAB — COMPREHENSIVE METABOLIC PANEL
ALT: 42 U/L (ref 0–44)
AST: 46 U/L — ABNORMAL HIGH (ref 15–41)
Albumin: 4.1 g/dL (ref 3.5–5.0)
Alkaline Phosphatase: 69 U/L (ref 38–126)
Anion gap: 10 (ref 5–15)
BUN: 18 mg/dL (ref 8–23)
CO2: 30 mmol/L (ref 22–32)
Calcium: 8.6 mg/dL — ABNORMAL LOW (ref 8.9–10.3)
Chloride: 93 mmol/L — ABNORMAL LOW (ref 98–111)
Creatinine, Ser: 0.37 mg/dL — ABNORMAL LOW (ref 0.44–1.00)
GFR, Estimated: >60 mL/min (ref >60)
Glucose, Bld: 160 mg/dL — ABNORMAL HIGH (ref 70–99)
Potassium: 4.1 mmol/L (ref 3.5–5.1)
Sodium: 133 mmol/L — ABNORMAL LOW (ref 135–145)
Total Bilirubin: 0.9 mg/dL (ref 0.3–1.2)
Total Protein: 7.4 g/dL (ref 6.5–8.1)

## 2020-09-13 LAB — CBC WITH DIFFERENTIAL/PLATELET
Abs Immature Granulocytes: 0.01 10*3/uL (ref 0.00–0.07)
Basophils Absolute: 0 10*3/uL (ref 0.0–0.1)
Basophils Relative: 0 %
Eosinophils Absolute: 0 10*3/uL (ref 0.0–0.5)
Eosinophils Relative: 1 %
HCT: 42.4 % (ref 36.0–46.0)
Hemoglobin: 14.1 g/dL (ref 12.0–15.0)
Immature Granulocytes: 0 %
Lymphocytes Relative: 9 %
Lymphs Abs: 0.5 10*3/uL — ABNORMAL LOW (ref 0.7–4.0)
MCH: 30.7 pg (ref 26.0–34.0)
MCHC: 33.3 g/dL (ref 30.0–36.0)
MCV: 92.4 fL (ref 80.0–100.0)
Monocytes Absolute: 0.5 10*3/uL (ref 0.1–1.0)
Monocytes Relative: 8 %
Neutro Abs: 4.7 10*3/uL (ref 1.7–7.7)
Neutrophils Relative %: 82 %
Platelets: 230 10*3/uL (ref 150–400)
RBC: 4.59 MIL/uL (ref 3.87–5.11)
RDW: 12.2 % (ref 11.5–15.5)
WBC: 5.8 10*3/uL (ref 4.0–10.5)
nRBC: 0 % (ref 0.0–0.2)

## 2020-09-13 LAB — LIPASE, BLOOD: Lipase: 33 U/L (ref 11–51)

## 2020-09-13 LAB — TROPONIN I (HIGH SENSITIVITY): Troponin I (High Sensitivity): 63 ng/L — ABNORMAL HIGH (ref <18)

## 2020-09-13 LAB — LACTIC ACID, PLASMA: Lactic Acid, Venous: 1 mmol/L (ref 0.5–1.9)

## 2020-09-13 MED ORDER — MORPHINE SULFATE (PF) 4 MG/ML IV SOLN
4.0000 mg | Freq: Once | INTRAVENOUS | Status: AC
  Administered 2020-09-13: 4 mg via INTRAVENOUS
  Filled 2020-09-13: qty 1

## 2020-09-13 NOTE — ED Triage Notes (Signed)
Pt transported from a [facility], where she became unresponsive following the administration of oral Morphine.

## 2020-09-13 NOTE — ED Provider Notes (Signed)
[facility] Emergency Department Provider Note  ____________________________________________  Time seen: Approximately 6:47 PM  I have reviewed the triage vital signs and the nursing notes.   HISTORY  Chief Complaint Altered Mental Status    Level 5 Caveat: Portions of the History and Physical including HPI and review of systems are unable to be completely obtained due to patient being a poor historian   HPI Catherine Graham is a 64 y.o. female with a history of ALS  who comes to the ED due to altered mental status and difficulty breathing from hospice house.  Patient had previously been on home hospice, and today went to hospice house for the first time.  While there over the course of a few hours she had a few episodes of hypopnea and depressed mental status whenever she positioned herself on her back, relieved by laying on her right side.  The patient requested additional evaluation and her family wish to honor this and had her transported to the ED.  They feel that she should continue with comfort care measures, affirmed that her CODE STATUS is DNR, do not want her to have CT scans or invasive tests.  They discussed this with her in my presence and she also affirms that she is not interested in extensive testing or treatment and wishes to focus on comfort.  I discussed with the hospice house nurse as well who notes periods of apnea when the patient was on her back resulting in coma, which gradually improved by physician the patient on her side.  They placed her on nasal cannula oxygen immediately upon arrival to hospice house noting hypoxia.     Past Medical History:  Diagnosis Date  . Gastritis   . Glaucoma   . HLD (hyperlipidemia)   . Hypertension      Patient Active Problem List   Diagnosis Date Noted  . Lumbar stenosis 07/23/2017     Past Surgical History:  Procedure Laterality Date  . CATARACT EXTRACTION, BILATERAL    . LUMBAR  LAMINECTOMY/DECOMPRESSION MICRODISCECTOMY N/A 07/23/2017   Procedure: LUMBAR LAMINECTOMY/DECOMPRESSION MICRODISCECTOMY 2 LEVELS L4-5,POSS.L5-S1;  Surgeon: Venetia Night, MD;  Location: [facility] ORS;  Service: Neurosurgery;  Laterality: N/A;  . SHOULDER SURGERY Right   . THYROID SURGERY       Prior to Admission medications   Medication Sig Start Date End Date Taking? Authorizing Provider  morphine (ROXANOL) 20 MG/ML concentrated solution Take 10 mg by mouth every hour as needed for severe pain.   Yes [provider]  traZODone (DESYREL) 50 MG tablet Take 50 mg by mouth at bedtime. 07/24/20  Yes [provider]     Allergies Patient has no known allergies.   History reviewed. No pertinent family history.  Social History Social History   Tobacco Use  . Smoking status: Never Smoker  . Smokeless tobacco: Never Used  Vaping Use  . Vaping Use: Never used  Substance Use Topics  . Alcohol use: No  . Drug use: No    Review of Systems Level 5 Caveat: Portions of the History and Physical including HPI and review of systems are unable to be completely obtained due to patient being a poor historian   Constitutional:   No known fever.  ENT:   No rhinorrhea. Cardiovascular:   No chest pain or syncope. Respiratory:   No dyspnea or cough. Gastrointestinal:   Negative for abdominal pain, vomiting and diarrhea.  Musculoskeletal:   Negative for focal pain or swelling ____________________________________________  PHYSICAL EXAM:  VITAL SIGNS: ED Triage Vitals  Enc Vitals Group     BP 09/13/20 1619 114/82     Pulse Rate 09/13/20 1619 (!) 120     Resp 09/13/20 1619 (!) 24     Temp 09/13/20 1619 99.2 F (37.3 C)     Temp src --      SpO2 09/13/20 1619 98 %     Weight 09/13/20 1625 150 lb (68 kg)     Height 09/13/20 1625 5\' 5"  (1.651 m)     Head Circumference --      Peak Flow --      Pain Score --      Pain Loc --      Pain Edu? --      Excl. in GC? --      Vital signs reviewed, nursing assessments reviewed.   Constitutional:   Awake and alert, not oriented.  Chronically ill-appearing Eyes:   Conjunctivae are normal. EOMI. PERRL. ENT      Head:   Normocephalic and atraumatic.      Nose:   No congestion/rhinnorhea.       Mouth/Throat:   MMM, no pharyngeal erythema. No peritonsillar mass.       Neck:   No meningismus. Full ROM. Hematological/Lymphatic/Immunilogical:   No cervical lymphadenopathy. Cardiovascular:   Tachycardia heart rate one hundred ten. Symmetric bilateral radial and DP pulses.  No murmurs. Cap refill less than 2 seconds. Respiratory:   Normal respiratory effort without tachypnea/retractions. Breath sounds are clear and equal bilaterally. No wheezes/rales/rhonchi. Gastrointestinal: PEG tube in place.  Soft and nontender. Non distended. There is no CVA tenderness.  No rebound, rigidity, or guarding.  Musculoskeletal:   Limited range of motion of the right arm.  Spastic paralysis of other extremities. Neurologic:   Aphasic Very limited ability to participate in exam. Skin:    Skin is warm, dry and intact. No rash noted.  No petechiae, purpura, or bullae.  ____________________________________________    LABS (pertinent positives/negatives) (all labs ordered are listed, but only abnormal results are displayed) Labs Reviewed  COMPREHENSIVE METABOLIC PANEL - Abnormal; Notable for the following components:      Result Value   Sodium 133 (*)    Chloride 93 (*)    Glucose, Bld 160 (*)    Creatinine, Ser 0.37 (*)    Calcium 8.6 (*)    AST 46 (*)    All other components within normal limits  CBC WITH DIFFERENTIAL/PLATELET - Abnormal; Notable for the following components:   Lymphs Abs 0.5 (*)    All other components within normal limits  URINALYSIS, COMPLETE (UACMP) WITH MICROSCOPIC - Abnormal; Notable for the following components:   Color, Urine YELLOW (*)    APPearance HAZY (*)    All other components within normal  limits  TROPONIN I (HIGH SENSITIVITY) - Abnormal; Notable for the following components:   Troponin I (High Sensitivity) 63 (*)    All other components within normal limits  URINE CULTURE  CULTURE, BLOOD (ROUTINE X 2)  CULTURE, BLOOD (ROUTINE X 2)  LIPASE, BLOOD  LACTIC ACID, PLASMA  LACTIC ACID, PLASMA  TROPONIN I (HIGH SENSITIVITY)   ____________________________________________   EKG    ____________________________________________    RADIOLOGY  DG Chest 1 View  Result Date: 09/13/2020 CLINICAL DATA:  Weakness and shortness of breath EXAM: CHEST  1 VIEW COMPARISON:  None. FINDINGS: The heart size and mediastinal contours are within normal limits. Mildly increased interstitial markings are seen at  both lung bases. Overall shallow degree of aeration. The visualized skeletal structures are unremarkable. IMPRESSION: Shallow degree of aeration with probable subsegmental atelectasis/chronic lung changes at both lung bases. Electronically Signed   By: Jonna Clark M.D.   On: 09/13/2020 17:43   DG Abdomen 1 View  Result Date: 09/13/2020 CLINICAL DATA:  Constipation, unresponsive EXAM: ABDOMEN - 1 VIEW COMPARISON:  None. FINDINGS: Supine frontal view of the abdomen and pelvis demonstrates percutaneous gastrostomy tube overlying left upper quadrant. Bowel gas pattern is unremarkable. Moderate stool throughout the colon. No masses or abnormal calcifications. IMPRESSION: 1. Moderate fecal retention. Electronically Signed   By: Sharlet Salina M.D.   On: 09/13/2020 17:40    ____________________________________________   PROCEDURES Procedures  ____________________________________________    CLINICAL IMPRESSION / ASSESSMENT AND PLAN / ED COURSE  Medications ordered in the ED: Medications  morphine 4 MG/ML injection 4 mg (4 mg Intravenous Given 09/13/20 1821)    Pertinent labs & imaging results that were available during my care of the patient were reviewed by me and considered in  my medical decision making (see chart for details).   Rodnisha Blomgren was evaluated in Emergency Department on 09/13/2020 for the symptoms described in the history of present illness. She was evaluated in the context of the global COVID-19 pandemic, which necessitated consideration that the patient might be at risk for infection with the SARS-CoV-2 virus that causes COVID-19. Institutional protocols and algorithms that pertain to the evaluation of patients at risk for COVID-19 are in a state of rapid change based on information released by regulatory bodies including the CDC and federal and state organizations. These policies and algorithms were followed during the patient's care in the ED.   Patient presents from hospice house due to hypoxia, requiring 2 L nasal cannula.  Clinical Course as of Sep 13 1854  Wed Sep 13, 2020  1640 Extensive discussion had with the patient, her husband, and her son at bedside.  They are all in agreement that they do not want ACLS, intubation, or even a CT scan of the chest and possible anticoagulation.  They want to limit testing and focus on comfort care.    [PS]    Clinical Course User Index [PS] Sharman Cheek, MD    ----------------------------------------- 6:53 PM on 09/13/2020 -----------------------------------------  Lab panel unremarkable, chest x-ray and abdominal x-ray essentially unremarkable.  After continued discussion with family, they are all in agreement with her returning back to hospice house tonight.  I spoke with hospice house RN Harriett Sine who confirms that they can receive her back, will call EMS for transport.  Based on evaluation today and progression of disease, expect the patient will soon pass tonight or within the next few days.   ____________________________________________   FINAL CLINICAL IMPRESSION(S) / ED DIAGNOSES    Final diagnoses:  ALS (amyotrophic lateral sclerosis) [facility])     ED Discharge Orders    None       Portions of this note were generated with dragon dictation software. Dictation errors may occur despite best attempts at proofreading.   Sharman Cheek, MD 09/13/20 [phone]

## 2020-09-13 NOTE — Discharge Instructions (Signed)
Lab tests and chest xray today were normal, and abdominal xray shows persistent constipation.

## 2020-09-13 NOTE — Progress Notes (Signed)
   09/13/20 1900  Clinical Encounter Type  Visited With Patient and family together;Health care provider  Visit Type Initial;Spiritual support  Referral From Nurse;Physician   This chaplain received a referral from the patient's nurse to provide support to her and her family. Upon arrival, the patient was observed to be laying in the bed with labored breathing. Her son was at the bedside attending to her needs and her husband was also present. The patient's son provided brief interpretation for [place] as his parents speak [place] with limited [place]. The patient's son, Ardeen Jourdain shared that things have been difficult and that he wants his mother to be comfortable. He did not want to get into the details of his mother's illness at her bedside, but shared that his foremost concern was that her pain would be addressed so that she is not suffering. This chaplain provided support through active and reflective listening, compassionate ministerial presence, and comfort. The patient's son and husband expressed gratitude for the support offered and provided, but express no additional needs at this time. I am available for support as needed.  Clovis Riley, Chaplain

## 2020-09-14 LAB — URINE CULTURE: Culture: NO GROWTH

## 2020-09-18 LAB — CULTURE, BLOOD (ROUTINE X 2)
Culture: NO GROWTH
Special Requests: ADEQUATE

## 2021-06-04 IMAGING — DX DG CHEST 1V
1 series · 1 of 1 positions shown · non-contrast
Comparison: None.

CLINICAL DATA: Weakness and shortness of breath

EXAM:
CHEST  1 VIEW

[chest ap]
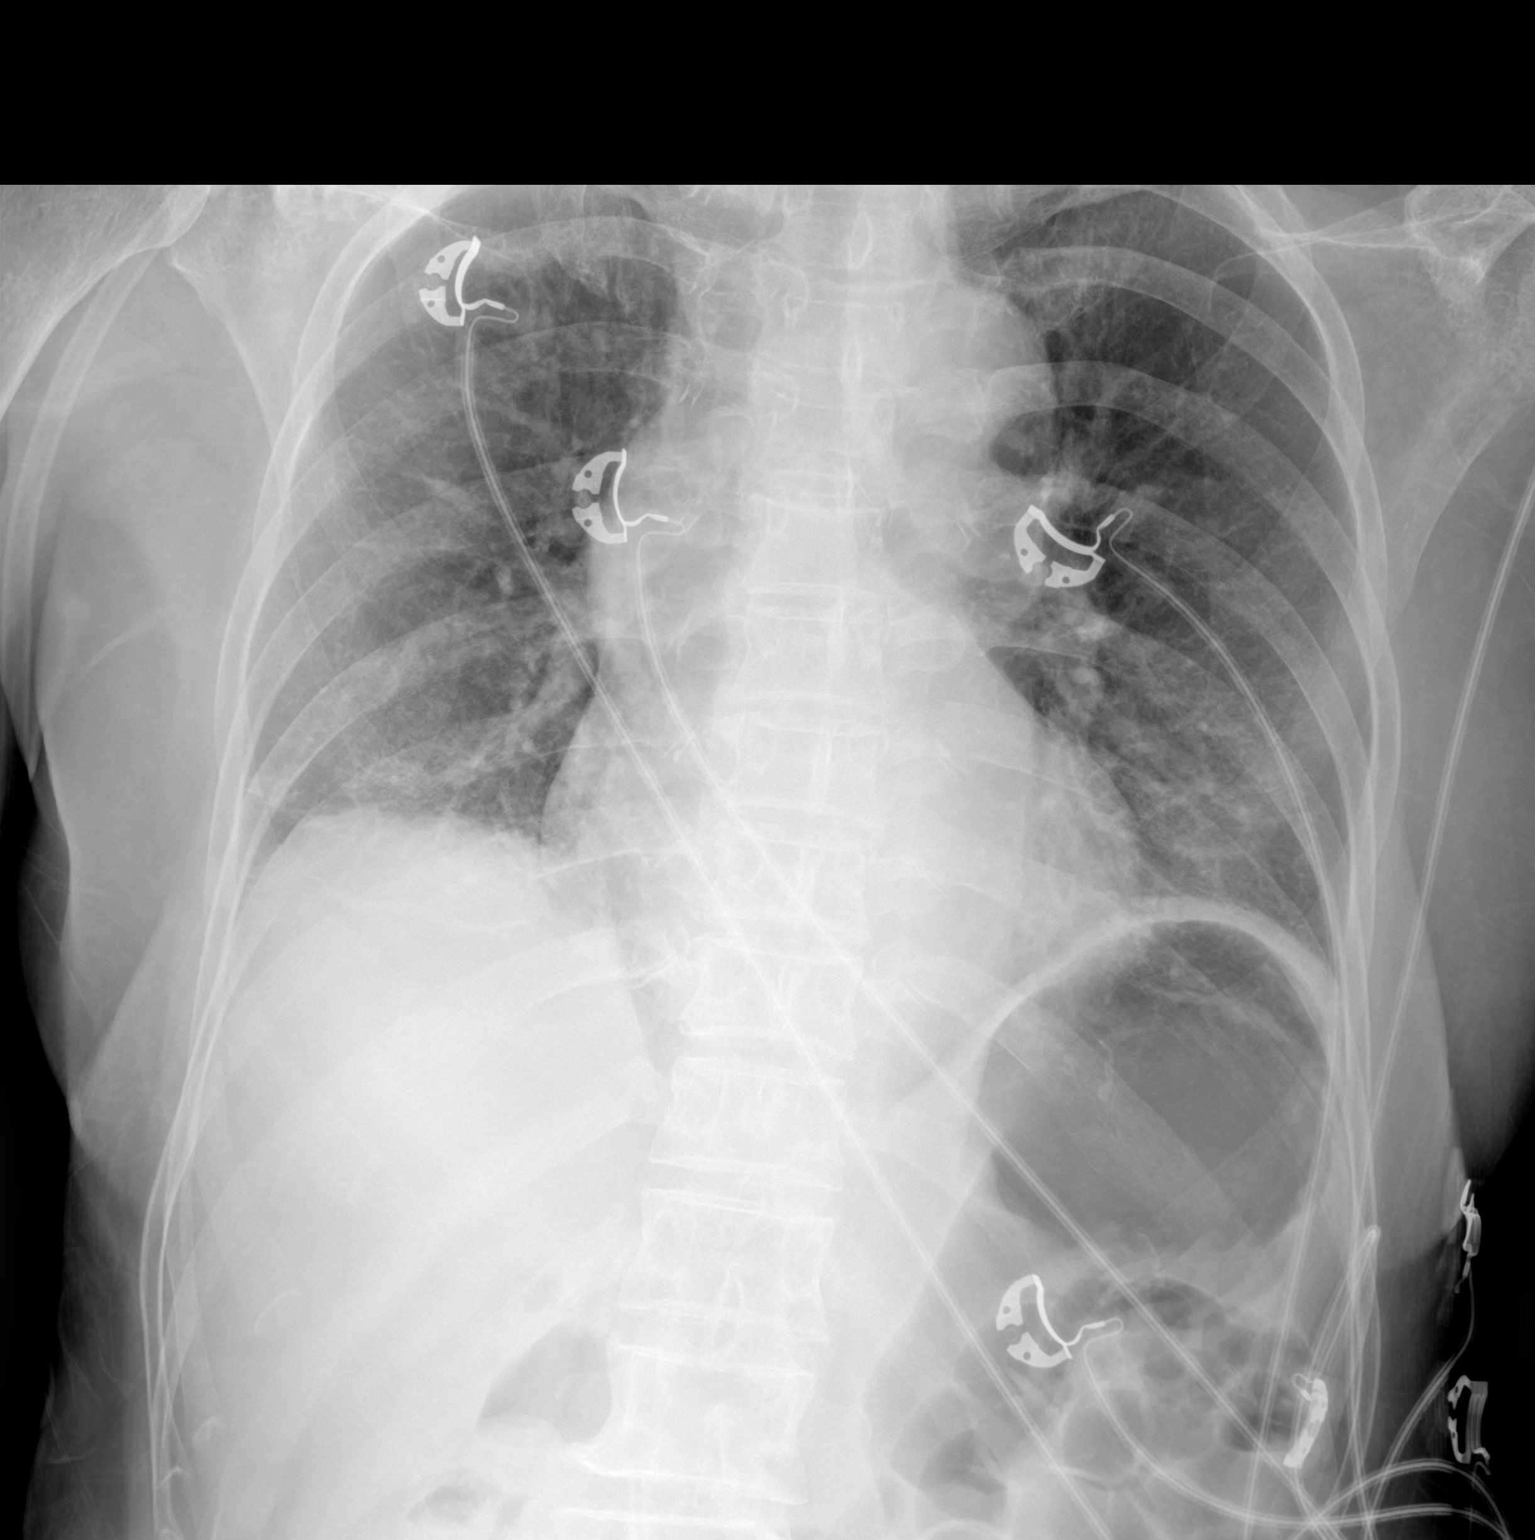

[1 of 1 positions shown; findings below may reference images not displayed]

FINDINGS: The heart size and mediastinal contours are within normal limits.
Mildly increased interstitial markings are seen at both lung bases.
Overall shallow degree of aeration. The visualized skeletal
structures are unremarkable.
IMPRESSION: Shallow degree of aeration with probable subsegmental
atelectasis/chronic lung changes at both lung bases.

## 2021-06-04 IMAGING — DX DG ABDOMEN 1V
1 series · 1 of 1 positions shown · non-contrast
Comparison: None.

CLINICAL DATA: Constipation, unresponsive

EXAM:
ABDOMEN - 1 VIEW

[abdomen supine]
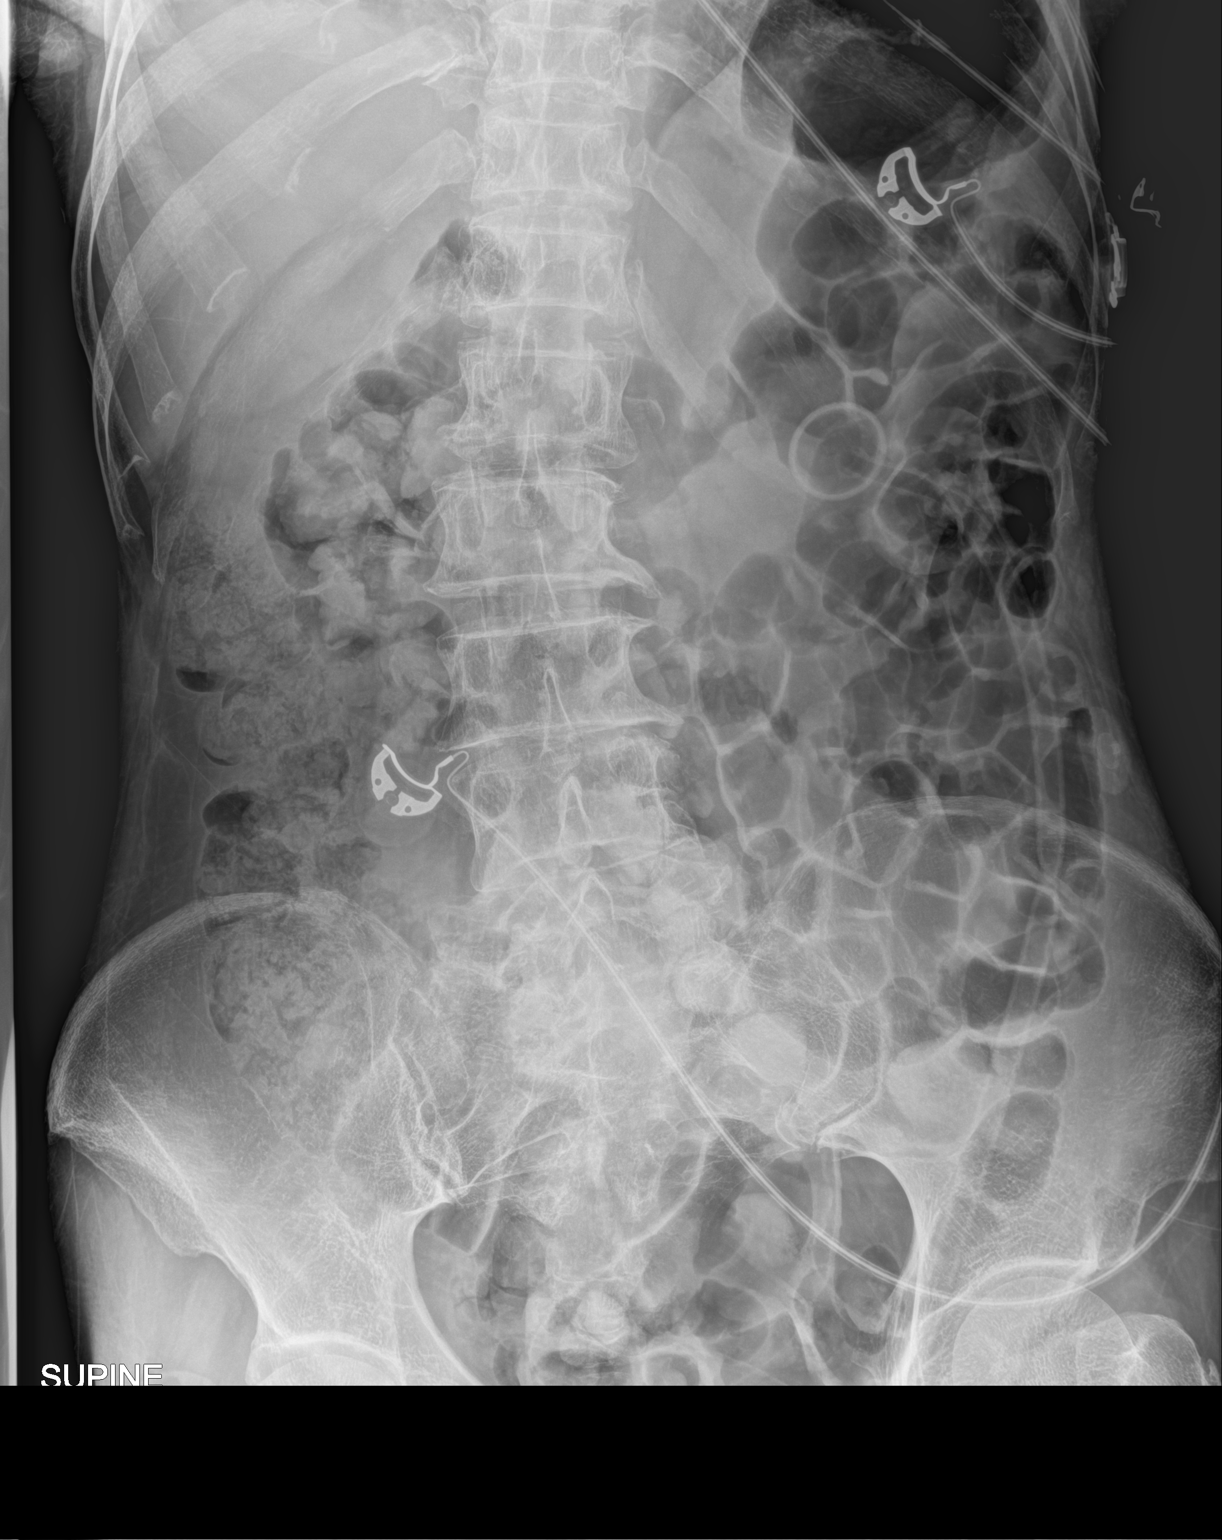

[1 of 1 positions shown; findings below may reference images not displayed]

FINDINGS: Supine frontal view of the abdomen and pelvis demonstrates
percutaneous gastrostomy tube overlying left upper quadrant. Bowel
gas pattern is unremarkable. Moderate stool throughout the colon. No
masses or abnormal calcifications.
IMPRESSION: 1. Moderate fecal retention.
